# Patient Record
Sex: Female | Born: 1965 | Race: Black or African American | Hispanic: No | Smoking: Current every day smoker
Health system: Southern US, Community
[De-identification: ages and names within clinical notes are randomized; demographics above are authoritative.]

## PROBLEM LIST (undated history)

## (undated) DIAGNOSIS — M5090 Cervical disc disorder, unspecified, unspecified cervical region: Secondary | ICD-10-CM

## (undated) DIAGNOSIS — D649 Anemia, unspecified: Secondary | ICD-10-CM

## (undated) DIAGNOSIS — Z8669 Personal history of other diseases of the nervous system and sense organs: Secondary | ICD-10-CM

## (undated) DIAGNOSIS — G709 Myoneural disorder, unspecified: Secondary | ICD-10-CM

## (undated) DIAGNOSIS — M7989 Other specified soft tissue disorders: Secondary | ICD-10-CM

## (undated) DIAGNOSIS — IMO0001 Reserved for inherently not codable concepts without codable children: Secondary | ICD-10-CM

## (undated) DIAGNOSIS — R519 Headache, unspecified: Secondary | ICD-10-CM

## (undated) DIAGNOSIS — R110 Nausea: Secondary | ICD-10-CM

## (undated) DIAGNOSIS — R0981 Nasal congestion: Secondary | ICD-10-CM

## (undated) DIAGNOSIS — K589 Irritable bowel syndrome without diarrhea: Secondary | ICD-10-CM

## (undated) DIAGNOSIS — Z5189 Encounter for other specified aftercare: Secondary | ICD-10-CM

## (undated) DIAGNOSIS — G43909 Migraine, unspecified, not intractable, without status migrainosus: Secondary | ICD-10-CM

## (undated) DIAGNOSIS — K469 Unspecified abdominal hernia without obstruction or gangrene: Secondary | ICD-10-CM

## (undated) DIAGNOSIS — G8929 Other chronic pain: Secondary | ICD-10-CM

## (undated) DIAGNOSIS — I10 Essential (primary) hypertension: Secondary | ICD-10-CM

## (undated) DIAGNOSIS — R51 Headache: Secondary | ICD-10-CM

## (undated) DIAGNOSIS — E785 Hyperlipidemia, unspecified: Secondary | ICD-10-CM

## (undated) HISTORY — DX: Other specified soft tissue disorders: M79.89

## (undated) HISTORY — DX: Nausea: R11.0

## (undated) HISTORY — DX: Essential (primary) hypertension: I10

## (undated) HISTORY — DX: Hyperlipidemia, unspecified: E78.5

## (undated) HISTORY — DX: Headache: R51

## (undated) HISTORY — DX: Cervical disc disorder, unspecified, unspecified cervical region: M50.90

## (undated) HISTORY — DX: Migraine, unspecified, not intractable, without status migrainosus: G43.909

## (undated) HISTORY — DX: Headache, unspecified: R51.9

## (undated) HISTORY — DX: Myoneural disorder, unspecified: G70.9

## (undated) HISTORY — DX: Anemia, unspecified: D64.9

## (undated) HISTORY — DX: Nasal congestion: R09.81

## (undated) HISTORY — DX: Reserved for inherently not codable concepts without codable children: IMO0001

## (undated) HISTORY — DX: Other chronic pain: G89.29

## (undated) HISTORY — DX: Irritable bowel syndrome, unspecified: K58.9

## (undated) HISTORY — DX: Personal history of other diseases of the nervous system and sense organs: Z86.69

## (undated) HISTORY — DX: Encounter for other specified aftercare: Z51.89

## (undated) HISTORY — DX: Unspecified abdominal hernia without obstruction or gangrene: K46.9

## (undated) HISTORY — PX: SMALL INTESTINE SURGERY: SHX150

---

## 1999-12-14 ENCOUNTER — Encounter: Admission: RE | Admit: 1999-12-14 | Discharge: 2000-03-13 | Payer: Self-pay | Admitting: Anesthesiology

## 2001-05-30 ENCOUNTER — Emergency Department (HOSPITAL_COMMUNITY): Admission: EM | Admit: 2001-05-30 | Discharge: 2001-05-30 | Payer: Self-pay | Admitting: *Deleted

## 2002-07-02 ENCOUNTER — Emergency Department (HOSPITAL_COMMUNITY): Admission: EM | Admit: 2002-07-02 | Discharge: 2002-07-02 | Payer: Self-pay | Admitting: Emergency Medicine

## 2002-07-02 ENCOUNTER — Encounter: Payer: Self-pay | Admitting: Emergency Medicine

## 2002-07-29 ENCOUNTER — Ambulatory Visit (HOSPITAL_COMMUNITY): Admission: RE | Admit: 2002-07-29 | Discharge: 2002-07-29 | Payer: Self-pay | Admitting: General Surgery

## 2002-07-29 ENCOUNTER — Encounter: Payer: Self-pay | Admitting: General Surgery

## 2003-08-12 ENCOUNTER — Ambulatory Visit (HOSPITAL_COMMUNITY): Admission: RE | Admit: 2003-08-12 | Discharge: 2003-08-12 | Payer: Self-pay | Admitting: *Deleted

## 2006-01-08 ENCOUNTER — Ambulatory Visit: Payer: Self-pay | Admitting: Gastroenterology

## 2006-01-09 ENCOUNTER — Inpatient Hospital Stay (HOSPITAL_COMMUNITY): Admission: EM | Admit: 2006-01-09 | Discharge: 2006-01-17 | Payer: Self-pay | Admitting: Emergency Medicine

## 2006-01-09 ENCOUNTER — Ambulatory Visit: Payer: Self-pay | Admitting: Gastroenterology

## 2006-01-11 ENCOUNTER — Encounter (INDEPENDENT_AMBULATORY_CARE_PROVIDER_SITE_OTHER): Payer: Self-pay | Admitting: Specialist

## 2006-01-21 ENCOUNTER — Inpatient Hospital Stay (HOSPITAL_COMMUNITY): Admission: EM | Admit: 2006-01-21 | Discharge: 2006-01-25 | Payer: Self-pay | Admitting: Emergency Medicine

## 2006-02-03 ENCOUNTER — Inpatient Hospital Stay (HOSPITAL_COMMUNITY): Admission: EM | Admit: 2006-02-03 | Discharge: 2006-02-08 | Payer: Self-pay | Admitting: Emergency Medicine

## 2006-07-30 ENCOUNTER — Inpatient Hospital Stay (HOSPITAL_COMMUNITY): Admission: RE | Admit: 2006-07-30 | Discharge: 2006-08-05 | Payer: Self-pay | Admitting: General Surgery

## 2006-11-07 ENCOUNTER — Encounter: Admission: RE | Admit: 2006-11-07 | Discharge: 2006-11-07 | Payer: Self-pay | Admitting: General Surgery

## 2007-04-16 ENCOUNTER — Ambulatory Visit: Payer: Self-pay | Admitting: Cardiology

## 2007-04-29 ENCOUNTER — Ambulatory Visit: Payer: Self-pay

## 2007-05-02 ENCOUNTER — Ambulatory Visit: Payer: Self-pay

## 2008-05-24 IMAGING — CR DG CHEST 1V PORT
1 series · 1 of 1 positions shown · non-contrast
Comparison: none

CLINICAL DATA: PICC placement.  Intussusception.  
 PORTABLE CHEST - 01/10/2006 AT   9599 HOURS:

[view not recorded]
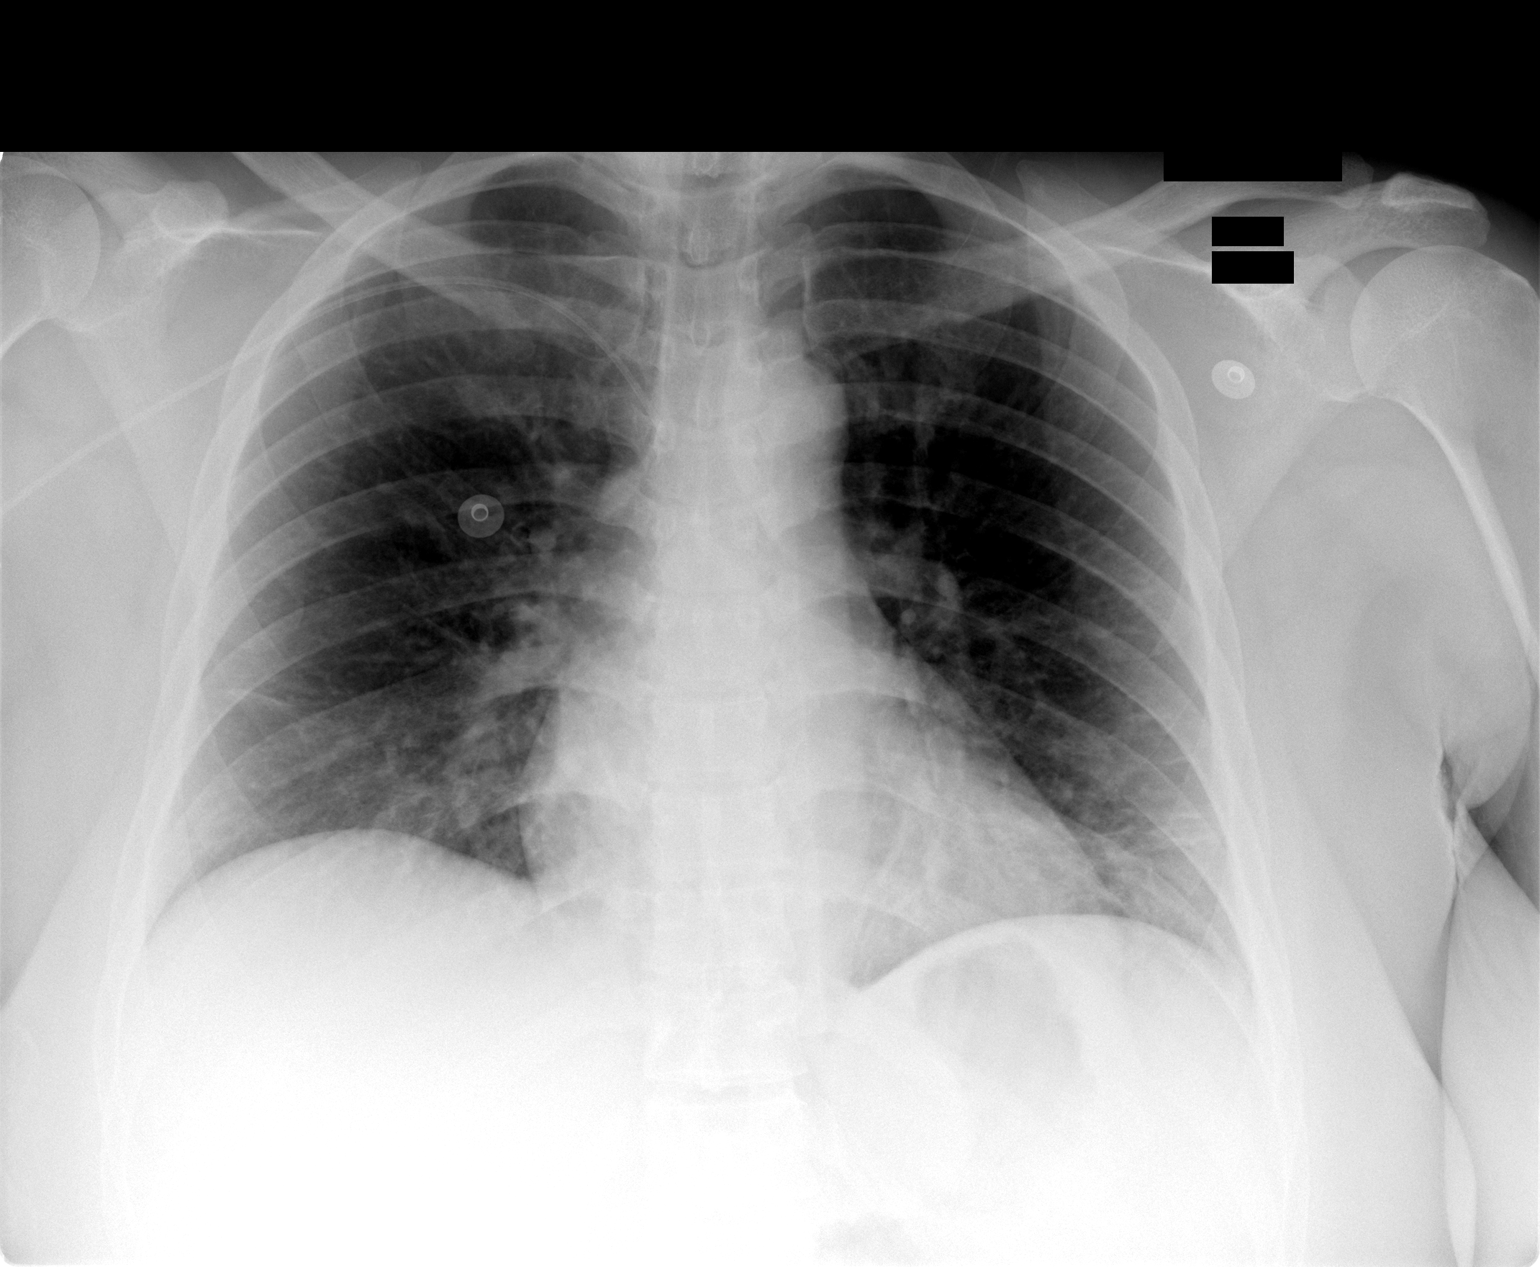

[1 of 1 positions shown; findings below may reference images not displayed]

FINDINGS: A right upper extremity PICC has been placed.  The tip projects over the upper SVC at the confluence of the right and left innominate veins.  Bibasilar linear atelectasis is noted with low lung volumes.  The heart is normal in size.
IMPRESSION: Right upper extremity PICC tip projects over the upper SVC.

## 2008-11-01 ENCOUNTER — Encounter: Admission: RE | Admit: 2008-11-01 | Discharge: 2008-11-01 | Payer: Self-pay | Admitting: General Surgery

## 2008-11-04 ENCOUNTER — Encounter: Admission: RE | Admit: 2008-11-04 | Discharge: 2009-02-02 | Payer: Self-pay | Admitting: Obstetrics & Gynecology

## 2009-11-25 ENCOUNTER — Encounter
Admission: RE | Admit: 2009-11-25 | Discharge: 2010-02-23 | Payer: Self-pay | Source: Home / Self Care | Attending: Physical Medicine & Rehabilitation | Admitting: Physical Medicine & Rehabilitation

## 2009-12-29 ENCOUNTER — Ambulatory Visit: Payer: Self-pay | Admitting: Physical Medicine & Rehabilitation

## 2010-03-25 ENCOUNTER — Encounter: Payer: Self-pay | Admitting: Family Medicine

## 2010-03-26 ENCOUNTER — Encounter: Payer: Self-pay | Admitting: *Deleted

## 2010-03-27 ENCOUNTER — Encounter: Payer: Self-pay | Admitting: Family Medicine

## 2010-03-27 ENCOUNTER — Encounter: Payer: Self-pay | Admitting: Obstetrics & Gynecology

## 2010-04-21 HISTORY — PX: HERNIA REPAIR: SHX51

## 2010-05-22 ENCOUNTER — Other Ambulatory Visit: Payer: Self-pay | Admitting: General Surgery

## 2010-05-22 DIAGNOSIS — Z1231 Encounter for screening mammogram for malignant neoplasm of breast: Secondary | ICD-10-CM

## 2010-05-25 ENCOUNTER — Ambulatory Visit: Payer: Self-pay

## 2010-07-18 NOTE — Assessment & Plan Note (Signed)
[facility]                            CARDIOLOGY OFFICE NOTE   [name], [name]                      MRN:          [id number]  DATE:04/16/2007                            DOB:          1965/09/02    The patient is a very pleasant 45 year old female with no prior cardiac  history whom I am asked to evaluate for chest pain and prior to ventral  hernia repair.  Note the patient does state that she has had  intermittent chest pain for approximately 2 years.  The pain is in the  left upper chest area and radiates down the left upper extremity.  It is  not exertional.  It can increase with inspiration and also certain  movements.  It is not related to food.  There is associated nausea and  shortness of breath.  There is no diaphoresis.  It can last anywhere  from minutes to an hour.  She is scheduled to have a ventral hernia  repair and we were asked to evaluate prior to surgery.  Note she also  has dyspnea on exertion.  There is occasional orthopnea but there is no  pedal edema.   MEDICATIONS:  1. Omeprazole 20 mg daily  2. Sertraline 100 mg tablets two p.o. daily  3. Oxycodone, methocarbamol, alprazolam as needed  4. Ambien as needed.  She takes ProAir as well.   ALLERGY:  PENICILLIN.   SOCIAL HISTORY:  She does smoke.  She does not consume alcohol.   FAMILY HISTORY:  Is positive for coronary artery disease as her mother  died of a myocardial function at age 20 by her report.   PAST MEDICAL HISTORY:  Significant for recently diagnosed hypertension.  There is no diabetes mellitus or hyperlipidemia.  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  It was removed and was  complicated by infection requiring 2 subsequent surgeries.  She then  developed a ventral hernia and is scheduled to have this repaired at  [facility] in the near future.  She has had a prior appendectomy as well as  all orthoscopic knee surgery.  There is also history of  anxiety by  report.   REVIEW OF SYSTEMS:  She denies any headaches or fevers, or chills.  There is no productive cough or hemoptysis,  there is no dysphagia,  odynophagia, melena or hematochezia.  There is no dysuria or hematuria.  Her menstrual cycles are abnormal at present as she has not had a cycle  in approximately 6 months since her most recent surgery.  She does have  some abdominal pain from her previous surgery as well.  There is no  rashes or seizure activity.  There is orthopnea, but there is no PND.  There is no pedal edema.  The remaining systems are negative.   PHYSICAL EXAMINATION:  Today shows a blood pressure of 126/85 and pulse  of 79.  She weighs 278 pounds.  She is well-developed and somewhat  obese.  She is no acute distress at present.  SKIN is warm and dry.  She is not depressed.  There is no peripheral  clubbing.  Her back is normal.  HEENT:  Normal with normal eyelids.  NECK is supple with a normal upstroke bilaterally.  No bruits heard.  There is no jugular venous distention and no thyromegaly noted.  CHEST is clear to auscultation, normal expansion.  CARDIOVASCULAR:  Regular rate and rhythm.  Normal S1-S2.  There are no  murmurs, rubs or gallops noted.  ABDOMEN:  Exam shows a diffuse tenderness to palpation.  She has had a  previous surgery and there is a ventral hernia as well as scar tissue.  It is tender to palpation.  I cannot appreciate hepatosplenomegaly  although the exam is somewhat difficult.  There are no bruits noted.  I  cannot palpate masses.  She has 2+ femoral pulses bilaterally.  No  bruits.  EXTREMITIES show no edema, I can palpate no cords.  She has 2+  dorsalis pedis pulses bilaterally.  NEUROLOGIC:  Grossly intact.  Her electrocardiogram shows sinus rhythm  at a rate of 72.  There are minor nonspecific ST changes.   DIAGNOSIS:  1. Chest pain - her symptoms are very atypical, but she does have      multiple risk factors and will require  ventral hernia repair in the      near future.  Will plan to proceed with adenosine Myoview.  If it      shows no ischemia then we will not pursue further cardiac      evaluation.  2. Preop evaluation - as per #1.  3. Recent hypertension - she will continue on her Felodipine  and this      was initiated recently by Dr. Loleta Chance and I have asked her to  follow-      up with him concerning that issue.  4. Ventral hernia - per general surgery.   We will see her back on as-needed basis pending results of her Myoview.     Madolyn Frieze Jens Som, MD, [facility]  Electronically Signed    BSC/MedQ  DD: 04/16/2007  DT: 04/17/2007  Job #: [id number]   cc:   Annia Friendly. Loleta Chance, MD  Gabrielle Dare Janee Morn, M.D.

## 2010-07-18 NOTE — Op Note (Signed)
[name], [name]             ACCOUNT NO.:  [id number]   MEDICAL RECORD NO.:  [id number]          PATIENT TYPE:  INP   LOCATION:  2550                         FACILITY:  [facility]   PHYSICIAN:  Gabrielle Dare. Janee Morn, M.D.DATE OF BIRTH:  [birth year]   DATE OF PROCEDURE:  07/30/2006  DATE OF DISCHARGE:                               OPERATIVE REPORT   PREOPERATIVE DIAGNOSIS:  Ventral incisional hernia.   POSTOPERATIVE DIAGNOSIS:  Ventral incisional hernia.   PROCEDURE:  Laparoscopic repair of ventral incisional hernia with mesh.   SURGEON:  Violeta Gelinas.   ASSISTANT:  Claud Kelp and Jimmye Norman.   ANESTHESIA:  General.   HISTORY OF PRESENT ILLNESS:  The patient is a 45 year old African  American female who underwent a small-bowel resection in [place] and  developed a postoperative wound infection.  She has completed treatment  for that, but she developed a ventral incisional hernia from this  previous wound.  We are proceeding today with elective laparoscopic  ventral incisional hernia repair.   PROCEDURE IN DETAIL:  Informed consent was obtained.  The patient  received intravenous antibiotics.  She was brought to the operating  room.  General anesthesia was administered by the anesthesia staff.  Her  abdomen was prepped and draped in a sterile fashion including an Ioban  drape.  Left lateral incision was made.  Quarter percent Marcaine with  epinephrine was used at all port sites.  An 11-mm hole was then placed  using the Optiview under direct vision without difficulty.  We then  insufflated the abdomen.  Camera was placed and revealed a lot of  abdominal adhesions up to the anterior bowel wall and into the hernia  defect.  Under direct vision, a right lateral port was placed  facilitating careful dissection of these adhesions.  Once these were  gradually swept away, we were able to place another 5-mm right lower  lateral port.  Then, we continued with very careful blunt  dissection  most of the adhesions and the hernia contents was omentum, but one piece  of small bowel was stuck up there.  It was protected, and the adhesions  were carefully taken down without injury to bowel.  Further dissection  freed up the left lower quadrant, and we were able to place a left lower  quadrant lateral 5-mm port.  Our 11-mm port was then noted to be a  little bit too close to the hernia edge, and it was placed more lateral  in order to facilitate access.  The remainder of the hernia contents  were reduced out of the abdomen, and further adhesiolysis was done.  The  hernia defect was then freed up.  The falciform ligament was divided  above in order to get a good overlay, and that was divided with the  laparoscopic LigaSure.  Excellent hemostasis was ensured.  Bowel area  was reinspected, and there was no enterotomies noted.  The size of the  defect was then measured out with spinal needles and marked off.  We  selected a piece of Parietex mesh that was 20 cm x 30 cm.  It  was marked  for orientation.  Eight 0 Prolene sutures were placed circumferentially  around it.  It was then rolled up and inserted into the abdomen.  The  sutures were brought up via small stab wounds circumferentially using  the EndoCatch, and these were all tied down securely.  The scope was put  back into the abdomen.  The mesh was up nicely except for on the left  lower quadrant area where it had slipped around the lip of the defect;  that suture was cut.  We then positioned the mesh again with a good 4 to  5 cm of overlap past the defect edge, and it was circumferentially  tacked with the Endotacker under direct vision.  We then placed more  concentric layer of staples where possible, a little bit closer to the  defect edge, achieving excellent application of the mesh.  Omentum and  everywhere the adhesiolysis was done was reinspected.  No active  bleeding was present and the bowel appeared intact.   We reinspected our  mesh from all angles, ensuring excellent tack application and  appearance.  Once this was done, the pneumoperitoneum was replaced, and  the ports were removed.  Wounds were copiously irrigated.  The port  sites were closed with skin staples, and the small stab wounds for the  sutures were closed with Benzoin and Steri-Strips.  Sponge, needle and  instrument counts were correct.  Sterile dressings were applied to all  wounds.  The patient tolerated the procedure well without apparent  complication and was taken to recovery in stable condition.      Gabrielle Dare Janee Morn, M.D.  Electronically Signed     BET/MEDQ  D:  07/30/2006  T:  07/30/2006  Job:  [id number]

## 2010-07-21 NOTE — Discharge Summary (Signed)
[name], TREMBLEY NO.:  [id number]   MEDICAL RECORD NO.:  [id number]          PATIENT TYPE:  INP   LOCATION:  5743                         FACILITY:  [facility]   PHYSICIAN:  Sandria Bales. Ezzard Standing, M.D.  DATE OF BIRTH:  07-09-1965   DATE OF ADMISSION:  02/03/2006  DATE OF DISCHARGE:  02/08/2006                               DISCHARGE SUMMARY   CHIEF COMPLAINT AND REASON FOR ADMISSION:  Ms. [name] is a 45 year old  female patient on disability secondary to chronic back pain, previously  worked as a Adult nurse.  She was admitted to [facility]  in early November with a small bowel obstruction secondary to  intussusception.  On January 14, 2006, she underwent resection of a small bowel tumor by  Dr. Lovell Sheehan.  Postoperative course was complicated by a wound infection  followed up with Dr. Lovell Sheehan on January 19, 2006.  The wound was  opened.  She grew out multiple organisms.  Wound care was done for 3  days and then Dr. Lovell Sheehan took the patient back to the operating room  and reclosed the wound with sutures with intact fascia noted at that  time.  She was taking Levaquin since that procedure and has noted  purulent drainage and burning pain in the wound since her follow up with  him on January 31, 2006.  On the morning of admission she noticed a large amount odor and purulent  discharge from the central portion of the wound, so she presented to the  [facility] ER.  In the ER she was afebrile, vital signs were stable,  her abdomen was benign with some purulent discharge from a small sinus  tract at the central portion of abdomen.  Wound was closed with  interrupted sutures.  There was foul odor noted to the drainage and a  small line of erythema around this area.  In the ER, Dr. Janee Morn  prepped and draped the wound in sterile fashion and opened the wound by  removing the sutures and about 150-200 mL of cloudy purulent discharge  was removed, cultures  were sent off.  Because of these findings, the  patient was admitted to the hospital.   ADMITTING DIAGNOSES:  1. Infected postoperative surgical incision.  2. Status post resection small-bowel tumor with intussusception.  3. History of depression and chronic back pain on disability for 5      years.   HOSPITAL COURSE:  The patient was admitted to the general floor as noted  where she was started on IV Cipro and Flagyl.  CT scan of the abdomen and pelvis was done and this did reveal a  tracking of abscess just beneath the mid portion of the wound tacking  laterally.  The CT showed no intraabdominal process.   Plans were to attempt to open this area at the bedside, but the patient  was reluctant to have this done.  She was very emotional, crying and  sobbing, would not let staff get near the wound, stating that she had  too much done and had gone through too much and she could not  take it  anymore and requested that the procedure be done under general  anesthesia.  On February 04, 2006, the patient was taken to the OR by  Dr. Ezzard Standing, where she underwent an open debridement of the abdominal  wound.  There was actually a large pocketing of abscess drained.  Please  refer to Dr. Allene Pyo operative note for further details.   In the postop period, the patient did well physiologically.  Emotionally  she was still having difficulty coping with an open wound and having  issues with recurrent infection.  There was some confusion as to the  medications the patient was taking at home.  She had one listing of  medications on a medication reconciliation sheet, but on discussing her  Xanax, she told me that she was taking 1 mg every 6 hours for the past  month and then when requestioned this, restated oh, that was before I  was having my surgical procedure that I was taking that much.  Eventually the patient was able to tolerate dressing changes better.   Because of her psychological and  emotional lability, it was discussed  with the patient and she agreed with [place] that psychiatric consult would  be indicated.  Dr. Jeanie Sewer did see the patient on February 06, 2006,  and felt the patient had anxiety and major depressive disorder and has  started her on a tapering dose of Zoloft with end dose to be at 200 mg  per day.   During the hospitalization, patient has also been reluctant to mobilize  and participate in self-care.  A lot of that has been related to her  depression, but she also is complaining of issues with back pain.  For  her chronic issues, PT evaluation was obtained and note home health PT  needs for recognized if the patient continued to be unsteady with a day  which was felt to be related to heavy dosing of pain medications than a  true gait disturbance.  They felt she was benefit from a walker if this  continued.   By date of discharge, the patient's wound was clean with a pink-red  granular base.  There was an exposed PDS suture and a few areas of  fibrin debris which Dr. Ezzard Standing felt could be reevaluated in a week at  the office.  The patient was requesting a shower chair at discharge due  to complaints of increasing back pain and abdominal pain when standing.  This request was honored.  Wound cultures grew out coag-negative staph  and it was felt that antibiotics would not need to be continued at  discharge.   FINAL DISCHARGE DIAGNOSES:  1. Postop wound infection, status post intraoperative incision and      drainage of abscess, status post recent small bowel tumor resection      with associated intussusception.  2. Anxiety and major depressive disorder, psychiatric evaluation this      admission.  3. Chronic back pain on disability.   DISCHARGE MEDICATIONS:  1. Zoloft 25 mg tablet, the patient is to take half a tablet or 12.5      mg and increase the dosage by half a tablet or 12.5 mg daily until     she is taking a total of 100 mg each morning.   Once she achieves      100 mg daily day dose, she is to increase the dose by 50 mg or 2      pills weekly until she  is at a final dose of 200 mg each a.m.  2. Percocet 5/325 one to two tablets every 4 hours as needed for pain,      #40 dispensed with no refills.  3. Xanax as before admission.  4. Ibuprofen over-the-counter 2-3 tablet every 8 hours as need for      pain.  5. Prilosec OTC or Protonix 40 mg daily while taking any ibuprofen.  6. Colace over-the-counter 100 mg b.i.d. for constipation.   DIET:  No restrictions.   ACTIVITY:  Increase activity slowly.  May shower.  No driving while  taking Percocet.  No lifting more than 10 pounds until the abdominal  wound is healed.   WOUND CARE:  Normal saline packing to the abdomen wound twice daily,  cover with dry dressing.  Home health RN to follow.   REFERRALS:  [facility] will be following the patient after  discharge, [phone].   FOLLOW UP APPOINTMENTS:  1. She needs to call Dr. Allene Pyo office, [phone] to be seen in one      week.  2. She needs to follow-up with her family doctor, Dr. Loleta Chance, to be seen      in 1 week.      Allison L. Rennis Harding, N.P.      Sandria Bales. Ezzard Standing, M.D.  Electronically Signed    ALE/MEDQ  D:  02/08/2006  T:  02/08/2006  Job:  [id number]   cc:   Dalia Heading, M.D.  Annia Friendly. Loleta Chance, MD  Antonietta Breach, M.D.

## 2010-07-21 NOTE — Consult Note (Signed)
NAMEMARIKAY, ROADS NO.:  [id number]   MEDICAL RECORD NO.:  [id number]          PATIENT TYPE:  INP   LOCATION:  5743                         FACILITY:  [facility]   PHYSICIAN:  Antonietta Breach, M.D.  DATE OF BIRTH:  [birth year]   DATE OF CONSULTATION:  DATE OF DISCHARGE:                                 CONSULTATION   REQUESTING PHYSICIAN:  Dr. Janee Morn.   REASON FOR CONSULTATION:  Anxiety and depression.   DATE OF CONSULTATION:  February 06, 2006.   HISTORY OF PRESENT ILLNESS:  [name] is a 45 year old  female admitted to the [facility] on February 03, 2006, due  to pus and drainage from an abdominal wound.   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  She developed a postoperative  wound infection.  She had a number of visits to [place] emergency  room for wound complications.  She states that the procedures that were  done to treat her wound infection involved leaving out pain medication  and anesthesia on a number of occasions.  Her procedures had to be  repeated a number of times due to ongoing pus and drainage from the  wound.  She states that the wound was closed too soon and resulted in  ongoing infection.   At the urgence of her family, she chose to turn to a new hospital.  They  drove her to [facility].  Upon evaluation at the [facility] Emergency  Room, the patient was again having foul-smelling purulent drainage from  her wound and was admitted to the [facility] for further  evaluation and treatment.   The patient required incision and drainage, 150 to 200 mL of cloudy  purulent discharge was removed, and a CT was taken.   CT revealed evidence of a subcutaneous loculation.  The wound was found  to be necrotic with subcutaneous pockets and she was taken back to the  inpatient ward where dressing changes have continued.   The patient  describes all evaluation and treatment done so far during  this hospital course with good pain control and good anesthetic control;  however, she continues to have nightmares about painful abdominal  procedures.  She has frequent feeling on edge, muscle tension, and some  shortness of breath.  She also has noted that when she is watching TV  she avoids any scenes that involve hospitals.  She has insomnia.   The patient's mood is moderately depressed with decreased energy and  some difficulty concentrating.  Two days ago she had a prayer that God  would allow her to die.  These thoughts of wishing death have passed now  and she has not had any thoughts of taking her own life.  She has not  had thoughts of harming others, delusions, or hallucinations.  She is  socially appropriate and cooperative with care.  Her orientation is  intact and her memory is intact.   PAST PSYCHIATRIC HISTORY:  The patient has no history of decreased need  for sleep  or heightened energy.  She does have a history of prior  periods of depression after her parents died.   The patient was treated with Zoloft successfully, increased to 200 mg  per day.  She also had some counseling.  She never has attempted  suicide, never had any psychiatric admissions.   FAMILY PSYCHIATRIC HISTORY:  None known.   SOCIAL HISTORY:  The patient has no children.  She is married and  currently separated.  She is not currently working.  She is on medical  disability due to a back injury.  She does not use any illegal drugs.  She dos not drink alcohol.  She has a strong support from her religion.  She used to work as a Systems analyst.   GENERAL MEDICAL PROBLEMS:  1. Chronic back pain.  2. History of appendectomy.  3. History of right lower extremity surgery.   MEDICATIONS:  The patient's MAR is reviewed.  She is currently on Xanax  1 mg q.6 hours p.r.n.  She has required 1.25 mg of Xanax today, so far  at 4 p.m.    ALLERGIES:  1. PENICILLIN.  2. MORPHINE SULFATE.  3. HYDROMORPHONE.   LABORATORY DATA:  WBC 9.8, hemoglobin 12.8, platelets 313, BUN 4,  creatinine 0.6, calcium 9.2.   REVIEW OF SYSTEMS:  CONSTITUTIONAL:  Afebrile.  HEENT:  Head:  No  trauma.  Eyes:  No visual changes.  Ears:  No hearing impairment.  Nose:  No rhinorrhea.  Mouth and throat, no sore throat.  NEUROLOGIC:  Unremarkable.  PSYCHIATRIC:  The patient has a history of previous Xanax  prescription a number of years ago when she was being with Zoloft.  In  the record from her previous hospitalization she was noted to be on 60  mg of Cymbalta per day and an unknown amount of Seroquel.  CARDIOVASCULAR:  No chest pain, palpitations, or edema, other than some  palpitations that have occurred when she is anxious.  RESPIRATORY:  No  coughing or wheezing.  GASTROINTESTINAL:  As above.  The original  procedure did involve a correction of small bowel obstruction and  intussusception.  GENITOURINARY:  No dysuria.  SKIN:  As above.  MUSCULOSKELETAL:  Chronic low back pain.  HEMATOLOGIC/LYMPHATIC:  Unremarkable.  ENDOCRINE/METABOLIC:  Unremarkable.   PHYSICAL EXAMINATION:  VITAL SIGNS:  Temperature 97.7, pulse 89,  respirations 20, blood pressure 133/53, O2 saturation on room air 97%.   MENTAL STATUS EXAM:  [name] is alert.  She is well-groomed with  good eye contact, lying in a partially-reclined, supine position in her  hospital bed, appearing her stated age.  Her affect is constricted with  tears and appropriate to content.  Her mood is depressed.  Thought  process is logical, coherent, goal directed.  No looseness of  associations.  Thought content, no thoughts of harming herself, no  thoughts of harming others.  No delusions, no hallucinations.  Her fund  of knowledge and intelligence are within normal limits.  Her speech  involves normal rate and prosody.  Her concentration is mildly decreased.  Her memory is intact to  immediate recent remote.  She is  oriented to all spheres.  Her insight is good.  Her judgment is intact.  She is socially appropriate.   ASSESSMENT:  Axis I.  293.84, anxiety disorder, not otherwise specified.  Axis II.  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  Axis III.  See general medical problems.  Axis IV.  General medical.  Axis V.  63.   [name] is not at risk to harm herself or others.  She agrees to  call emergency services immediately for any thoughts of harming herself,  thought of harming others, or distress.   The undersigned provided ego-supportive psychotherapy and education.  The undersigned also introduced the patient to the principles of  cognitive behavioral therapy and advised her to start sessions each day  where she confronts the catastrophic thoughts regarding going home.   The patient has been having frequent catastrophic thinking about going  home and having the same wound complications re-emerging.  The  undersigned demonstrated to the patient how to confront her  catastrophizing thoughts with prognostic thinking that is based upon the  updated procedure that she has undergone along with the average rate of  complications given a normal immune system, etc.  She understood the  discussion and will proceed with confronting her thinking about the  future to shape thoughts into those that are based on the normal rates  of complications.  She was also re instructed in deep breathing and  progressive muscle relaxation, instruction which she has received before  when she underwent multidisciplinary pain treatment a number of years  ago.   The indications, alternatives, and adverse effects, of Zoloft and Ativan  were discussed with the patient.  She understands above information  including the risk of dependence with Ativan and would like to proceed  as follows below.   RECOMMENDATIONS:  1. Restart Zoloft at a very low dose given the patient's  potentially      sensitive gastrointestinal condition:  Zoloft 12.5 mg q. a.m. and      then advance as tolerated by 12.5 mg per day to 100 mg p.o. q. a.m.      At that point, would increase by 50 mg per week to the target dose      of 200 mg q. a.m., the previous effective dosage.  Zoloft is      utilized at this point both for anti depression and antianxiety.  2. Would switch from Xanax over to Ativan.  They have equal potency;      however, Ativan provides a diversity of routes as well as no active      metabolites that can result in accumulative sedation.  Ativan 1 mg      p.o., IM, or IV q.6 hours p.r.n. acute anxiety will be written.  3. The patient will proceed with practicing progressive muscle      relaxation and deep breathing.  She will also confront her      cognitive distortions with rational thoughts based on the average      post wound complication rates as well as the progress she is making      now with her current wound treatment. 4. Regarding preliminary discharge planning:  The patient appears to      be in excellent candidate for outpatient cognitive behavioral      therapy as well as psychotropic medication management.  Would ask      the case manager to set this patient up within the first week of      discharge at one of the outpatient psychiatric clinics of [facility], [facility], or [facility].   The optimal results from using an SSRI with cognitive behavioral therapy  in treating anxiety may not be seen until the 16th week, but many  symptoms of depression will improve much sooner.  Antonietta Breach, M.D.  Electronically Signed     JW/MEDQ  D:  02/06/2006  T:  02/07/2006  Job:  [id number]

## 2010-07-21 NOTE — Discharge Summary (Signed)
[name], MCCONAHY NO.:  [id number]   MEDICAL RECORD NO.:  [id number]          PATIENT TYPE:  INP   LOCATION:  A303                          FACILITY:  [facility]   PHYSICIAN:  Dalia Heading, M.D.  DATE OF BIRTH:  12/13/1965   DATE OF ADMISSION:  01/09/2006  DATE OF DISCHARGE:  11/15/2007LH                                 DISCHARGE SUMMARY   AGE:  45 years old.   HOSPITAL COURSE:  The patient is a 45 year old black female who presented to  the emergency room with worsening abdominal pain, nausea, and vomiting.  A  CT scan of the abdomen and pelvis was done at the time of admission which  revealed a partial small-bowel obstruction secondary to intussusception of  the small bowel.  She was admitted to hospital for further evaluation and  treatment.  Her symptoms did not improve over the next 24 hours, thus the  patient went to the operating room on January 11, 2005 and underwent a  partial small bowel resection.  She was found to have a large mass in the  mid portion of the jejunum.  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  Further immunohistological stains were  pending.  Her postoperative course was remarkable for mild fevers.  Her  white blood cell count was virtually normal on January 15, 2006.  Her diet  was advanced without difficulty once her bowel function returned.   The patient is being discharged home on January 17, 2006 in good improving  condition.   DISCHARGE INSTRUCTIONS:  The patient is to followup with Dr. Franky Macho on  January 22, 2006.   DISCHARGE MEDICATIONS:  1. Vicodin 1-2 tablets p.o. q.4 h p.r.n. pain.  2. Phenergan 25 mg p.o. q.6 h p.r.n. nausea.  3. She is to resume all her other medications as previously prescribed.   PRINCIPAL DIAGNOSES:  1. Intussusception of small bowel.  2. Benign neoplasm, small-bowel.  3. Depression.  4. Chronic back pain.   PRINCIPAL PROCEDURE:  Partial small  bowel resection on January 11, 2006.      Dalia Heading, M.D.  Electronically Signed     MAJ/MEDQ  D:  01/17/2006  T:  01/17/2006  Job:  [id number]   cc:   Kassie Mends, M.D.  [address]  [place] , [place] [address]   Annia Friendly. Loleta Chance, MD  Fax: [phone]

## 2010-07-21 NOTE — Consult Note (Signed)
NAMETINITA, BROOKER             ACCOUNT NO.:  [id number]   MEDICAL RECORD NO.:  [id number]          PATIENT TYPE:  INP   LOCATION:  A303                          FACILITY:  [facility]   PHYSICIAN:  Kassie Mends, M.D.      DATE OF BIRTH:  Dec 18, 1965   DATE OF CONSULTATION:  01/09/2006  DATE OF DISCHARGE:                                   CONSULTATION   REASON FOR CONSULTATION:  Abdominal pain and intussusception.   HISTORY OF PRESENT ILLNESS:  Ms. Mower is a 45 year old female who was  seen in clinic on November 6 complaining of diarrhea, abdominal pain, and  bloating.  She was being worked up for her abdominal pain, diarrhea, and  bloating, and was scheduled for an upper endoscopy.  She states she went  home and took the medicine and tried to sleep but then started throwing up  and dry heaving.  Her symptoms got acutely worse and she reported not being  able to keep anything down.  She tried to keep down water and Jello.  Her  last bowel movement was on the day that she saw me.  She denied any blood in  her vomit, heartburn, or chest pain.  She denied any fever.  She continues  to feel nauseated but her pain is now controlled.   PAST MEDICAL HISTORY:  1. Migraines.  2. Asthma.  3. Back pain.  4. Knee pain.  5. Arthritis.  6. Hypertension.   PAST SURGICAL HISTORY:  1. Right knee surgery.  2. Appendectomy.  3. Right carpal tunnel surgery.  4. Back surgery.   ALLERGIES:  PENICILLIN AND ASPIRIN.   MEDICATIONS:  1. Levaquin.  2. Nicoderm.  3. Protonix.  4. Dilaudid as needed.  5. Zofran as needed.   SOCIAL HISTORY:  She is married and has no children.  She smokes one pack a  day.  She occasionally drinks alcohol.   REVIEW OF SYSTEMS:  As per the HPI, otherwise, all systems are negative.   PHYSICAL EXAMINATION:  VITAL SIGNS:  Temperature 99.2, blood pressure 150/75, weight 108 kilos.  GENERAL:  She is in no apparent distress, alert and oriented x4.  HEENT:  Exam is  normocephalic, atraumatic, pupils equal and reactive to light, mouth  with no oral lesions.  Posterior pharynx without erythema or exudate.  NECK:  Full range of motion, no lymphadenopathy. LUNGS:  Clear to auscultation  bilaterally.  CARDIOVASCULAR EXAM:  Regular rhythm, no murmur, normal S1 and  S2.  ABDOMEN:  Bowel sounds are present, soft, distended, mild tenderness to  palpation in all four quadrants, no rebound or guarding.  No abdominal  bruits.  EXTREMITIES:  No cyanosis, clubbing, and edema.  NEUROLOGICAL:  No  focal neurological deficits.   LABORATORY DATA:  White count 17.8, hemoglobin 14.9, platelets 378.  BUN 10,  creatinine 1, normal liver enzymes.   ASSESSMENT:  Ms. Kloosterman is a 45 year old female with small bowel  intussusception of unclear etiology.  She has now developed persistent  vomiting and is not passing gas or stool.  She still continues to complain  of nausea.  Thank you for allowing me to see Ms. Guinther in consultation.  My recommendations follow.   RECOMMENDATIONS:  1. Will attempt to place NG tube this afternoon with sedation in short      stay.  2. Will change Zofran to q.4h. as needed for more ideal control of nausea.  3. If her intussusception resolves spontaneously, then would perform      capsule endoscopy to evaluate her small intestines.  4. Will cancel endoscopy scheduled for January 10, 2006.      Kassie Mends, M.D.  Electronically Signed     SM/MEDQ  D:  01/10/2006  T:  01/10/2006  Job:  [id number]

## 2010-07-21 NOTE — H&P (Signed)
[facility]  Patient:    [name], [name]                        MRN: [id number] Adm. Date:  [id number] Attending:  Thyra Breed CC:         _____________,  Jonita Albee, [place]  Dr. Mirna Mires, [place], [place]   History and Physical  NEW PATIENT EVALUATION  HISTORY:  The patient is a 45 year old who is sent to [place] by ______ for a series of lumbar epidural steroid injection.  The patient states that she was in her usual state of health up until June 28, 1999, when she fell down stairs and injured her back and right knee. She initially had her right knee addressed and two months after the fall, apparently had surgical intervention for a medial meniscal repair and medial collateral ligament tear.  She stated that she continues to have a lot of sharp pain in that knee.  She has been treated on Percocet, which helps her, Skelaxin, which was of no benefit, Zanaflex, which was not helpful, that made her sleepy, Vicodin, which made her hyperactive and sleepy, and Vioxx, which helps to some degree.  She describes her pain as a sharp pain predominantly localized to the knee with associated numbness emanating over the anterior aspect of the right thigh.  Occasionally, it will go down to the toes, involving the fourth, fifth and sometimes third and ______ toes.  She described give-away weakness of her lower extremities and problems with balance at times.  She denied any bowel or bladder incontinence but does have urgency.  Her pain is made worse by standing or sitting long periods of time and improved by a hot tub of water and two Percocets.  The Percocet lasts about three hours.  The patient alleges that her migraine headaches have returned and depression profoundly intensified following the fall.  X-rays of her back have revealed bulging at 4-5 and 5-S1, with an MRI showing a diffuse bulge at 4-5 minimally, indenting the thecal sac, with no evidence of neural compression or  disk herniation.  She is sent today for consideration for epidural steroid injections.  She states that she hurts in her head and up into her upper back and especially to the right side and predominantly into the knee.  She has poor quality of sleep.  CURRENT MEDICATIONS:  Percocet, Ortho-Cyclen and Zoloft.  ALLERGIES:  PENICILLIN and ASPIRIN.  FAMILY HISTORY:  Positive for coronary artery disease, hypertension and diabetes.  PAST SURGICAL HISTORY:  Significant for right wrist surgery by ______ in 1995 for a work-related injury, appendectomy and a right knee surgery.  SOCIAL HISTORY:  The patient is a pack-per-day smoker.  She does not drink alcohol.  She has been out of work and on disability since her accident.  ACTIVE MEDICAL PROBLEMS:  Depression and migraine headaches.  REVIEW OF SYSTEMS:  GENERAL:  Negative.  HEAD:  Significant for migraine headaches.  EARS:  Significant for some tinnitus of the left ear. NOSE/MOUTH/THROAT:  Negative.  EYES:  Significant for strain.  LUNGS: Negative.  CARDIOVASCULAR:  Negative.  GI:  Negative.  GU:  Negative. MUSCULOSKELETAL/NEUROLOGIC:  See HPI.  No history of seizures or strokes. CUTANEOUS:  Negative.  HEMATOLOGIC:  History of anemia in the past. ENDOCRINE:  Negative.  PSYCHIATRIC:  Significant for depression. ALLERGY/IMMUNOLOGIC:  The patient states that she does have difficulties with hayfever at times.  EXAMINATION  VITAL SIGNS:  Heart rate  is 71, respiratory rate is 16, blood pressure is 96/68, O2 saturation is 98% and pain level is 8/10; she did not take any medications this morning.  GENERAL:  This is a pleasant female who is teary-eyed and has prominent pain behavior.  HEENT:  Head was normocephalic and atraumatic.  Eyes:  Extraocular movements were intact with conjunctivae and sclerae clear.  Nose:  Patent nares without discharge.  Oropharynx was free of lesions.  NECK:  Supple without lymphadenopathy.  Carotids are 2+ and  symmetric without bruits.  MUSCULOSKELETAL:  She had tenderness through the trapezius muscles, interscapular regions as well as over the anterior chest wall, lateral epicondylar regions and to a lesser extent over the anserine areas and distal malleoli, left greater than right.  Trochanteric areas were minimally tender. The patient stated she was unable to lie flat without a pillow under her back.  LUNGS:  Clear.  HEART:  Regular rate and rhythm.  BREASTS:  Not performed.  ABDOMEN:  Not performed.  PELVIC:  Not performed.  RECTAL:  Not performed.  BACK:  Exam revealed diffuse tenderness of the paraspinous muscles, with negative straight leg raise signs.  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  I could not appreciate any effusion of the joint.  She hyperextends the knees bilaterally by about 10 degrees and can flex to about 110 degrees.  NEUROLOGIC:  The patient was oriented x 4.  Cranial nerves II-XII were grossly intact.  Deep tendon reflexes were symmetric in the upper and lower extremities with downgoing toes.  Motor was grossly intact with symmetric bulk and tone.  Sensory was significant for attenuated pinprick over the anterior aspect of the right knee, but intact over the feet.  Vibratory sense was intact.  Coordination was grossly intact.  IMPRESSION 1. Diffuse pain syndrome with what appears to be prominent pain behavior.  She    obviously is in a lot of discomfort from her right knee, where minimal    pressure over the patella elicits a severe discomfort.  She exhibited    tender points throughout her back and in her upper extremities. 2. History of depression. 3. Migraine headaches. 4. Allergies.   DISPOSITION:  I discussed the potential risks, benefits and limitations of a lumbar epidural steroid  injection in detail with the patient.  After reviewing these, the patient was very concerned about proceeding.  She seems to want a lot of reassurance that this will be helpful to her and that she will not have any problems and I advised her that I cannot guarantee the procedure, but only review the potential benefits, limitations and risks.  She seems to be very concerned about proceeding with this.  I advised her that it would likely not help with her interscapular discomfort, migraine headaches or right upper extremity discomfort; it may or may not benefit all of her knee pain, as some of it seems to have a radicular quality to it and other parts of it seem to be more intrinsic to the knee joint itself.  After discussing options with her, she wishes to return back to see ______ to discuss any further block therapy with him in greater detail.  I advised the patient that at this point I had little further to add to her management.  She plans to follow up with  ______ and Dr. Mirna Mires in [place]. DD:  01/19/00 TD:  01/19/00 Job: [id number]

## 2010-07-21 NOTE — H&P (Signed)
NAMEDELMI, FULFER NO.:  [id number]   MEDICAL RECORD NO.:  [id number]          PATIENT TYPE:  INP   LOCATION:  A303                          FACILITY:  [facility]   PHYSICIAN:  Dalia Heading, M.D.  DATE OF BIRTH:  1965-12-09   DATE OF ADMISSION:  01/09/2006  DATE OF DISCHARGE:  LH                                HISTORY & PHYSICAL   CHIEF COMPLAINT:  Abdominal pain.   HISTORY OF PRESENT ILLNESS:  The patient is a 45 year old black female who,  for the past 2 weeks, has been having intermittent crampy abdominal pain.  She was seen by Dr. Cira Servant yesterday, of Gastroenterology, and was scheduled  to undergo an EGD on Friday with the preliminary diagnosis of IBS.  Due to  worsening abdominal pain, nausea, vomiting, she presented to the emergency  room.  CT scan of the abdomen and pelvis reveals a partial bowel obstruction  secondary to a small bowel intussusception in the left side of the abdomen.  On CT scan, there is no free air or free fluid present within the abdominal  cavity.  The patient currently is having mild pain but no nausea or  vomiting.  Her bowel movements have been normal and she last had a bowel  movement earlier today.   PAST MEDICAL HISTORY:  1. Depression.  2. Chronic back pain.   PAST SURGICAL HISTORY:  1. Appendectomy.  2. Right arm surgery.  3. Attempted back surgery in the past.   CURRENT MEDICATIONS:  1. Promethazine.  2. Omeprazole.  3. Hydrocodone.  4. Celebrex.  5. Lidoderm.  6. Maxalt.  7. Combivent.  8. Cymbalta.  9. Seroquel.   ALLERGIES:  PENICILLIN WHICH IS CAUSED WHICH CAUSES SWELLING.   REVIEW OF SYSTEMS:  The patient does smoke.  She denies any alcohol use.   PHYSICAL EXAMINATION:  GENERAL:  The patient is a well-developed, well-  nourished black female in no acute distress.  VITAL SIGNS:  She is afebrile.  Vital signs stable.  LUNGS:  Clear to auscultation with good breath sounds bilaterally.  HEART:  Regular  rate and rhythm without history, S4, murmurs.  ABDOMEN:  Soft with nonspecific tenderness in the upper abdomen.  No  significant distension is noted.  No rigidity is noted.  RECTAL:  Deferred at this time.   LABORATORY DATA:  White blood cell count is 17.8, hematocrit 44, platelet  count 378.  88 segs, 6 lymphs, no bands.  Met-7 is within normal limits.  Liver enzyme tests are within normal limits.  Urine pregnancy test is  negative.   IMPRESSION:  Partial small-bowel obstruction secondary to small bowel  intussusception.   PLAN:  As the patient is more comfortable and not having episodes of nausea  or vomiting, she will be admitted to the hospital for further evaluation  treatment.  She has been told that she may end up having surgery to correct  the intussusception.  Should this resolve, a small bowel examination will be  performed by either Givens capsule or small bowel series.  Dr. Cira Servant will be  consulted in the morning  to notify her of the patient's admission.      Dalia Heading, M.D.  Electronically Signed     MAJ/MEDQ  D:  01/09/2006  T:  01/10/2006  Job:  [id number]   cc:   Kassie Mends, M.D.  [address]  [place] , [place] [address]   Annia Friendly. Loleta Chance, MD  Fax: [phone]

## 2010-07-21 NOTE — H&P (Signed)
[name], [name]             ACCOUNT NO.:  [id number]   MEDICAL RECORD NO.:  [id number]          PATIENT TYPE:  INP   LOCATION:  5743                         FACILITY:  [facility]   PHYSICIAN:  Gabrielle Dare. Janee Morn, M.D.DATE OF BIRTH:  May 04, 1965   DATE OF ADMISSION:  02/03/2006  DATE OF DISCHARGE:                              HISTORY & PHYSICAL   CHIEF COMPLAINT:  Pain and drainage from abdominal wound.   HISTORY OF PRESENT ILLNESS:  The patient is a 45 year old African  American female who was initially admitted to [facility] on  January 10, 2006, with small bowel obstruction.  She underwent  exploration by Dr. 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  She was discharged home  postoperatively but returned with a wound infection on January 19, 2006.  Dr. Lovell Sheehan readmitted her at that time.  Her wound was opened,  cultures grew out multiple organisms.  Wound care was done for 3 days,  then Dr. Lovell Sheehan took her back to the operating room and closed her  wound with sutures.  Fascia was reported to be intact at that time.  She  subsequently was discharged from the hospital.  She saw him on January 31, 2006, in his office.  She has been taking Levaquin since being  discharged the second time.  She has been having increasing burning pain  in her wound.  This morning she noted a large odor and purulent  discharge from the central portion.  She came to the [facility]  Emergency Department for evaluation.  She claims she does not want to  see Dr. Lovell Sheehan at this time.   PAST MEDICAL HISTORY:  1. Depression.  2. Chronic back pain.   PAST SURGICAL HISTORY:  1. Appendectomy.  2. Right lower extremity surgery.  3. Small-bowel resection as above.   MEDICATIONS RECENTLY AT HOME:  Tylox and Levaquin.   ALLERGIES:  PENICILLIN.   REVIEW OF SYSTEMS:  A 15 system review was completed.  It is positive  for the wound pain and  drainage as noted above, otherwise from a  gastrointestinal standpoint, she has been eating and moving her bowels  and passing gas without difficulty.  The remainder of the system review  was negative.   PHYSICAL EXAMINATION:  VITAL SIGNS:  Temperature 98.1, blood pressure  122/74, heart rate 82, respirations 20.  GENERAL:  She is awake, alert and very anxious.  HEENT:  Pupils are equal.  Sclerae is clear.  NECK:  Supple with no masses.  LUNGS:  Are clear to auscultation.  No significant wheezing is present.  Respiratory excursion is good.  HEART:  Regular with no murmur.  ABDOMEN:  Soft with active bowel sounds.  She has some purulent  discharge from a small sinus of the central portion of her wound.  Her  wound is closed with interrupted sutures.  The purulent discharge does  have a foul order.  She has a small amount of erythema inferior to the  central portion of the wound.  SKIN:  Warm and  dry with no rashes.   PROCEDURE:  The patient's wound was prepped and draped in sterile  fashion.  The central portion of the wound was opened by removing the  sutures.  About 150-200 mL of cloudy purulent discharge was removed.  Cultures have been sent by emergency department staff.  The wound was  irrigated out thoroughly and packed with wet to dry sterile gauze  dressing.  The patient tolerated this well.   IMPRESSION:  Wound infection after small-bowel obstruction.  We will  admit her to the hospital.  The wound was incised and drained as above.  We will begin twice a day dressing changes.  I will place her on Cipro  and Flagyl IV.  We will also check a CT scan of the abdomen and pelvis  to rule out intra-abdominal abscess or evidence of small bowel fistula.  Plan was discussed in detail with the patient and her aunt.  Questions  were answered.      Gabrielle Dare Janee Morn, M.D.  Electronically Signed     BET/MEDQ  D:  02/03/2006  T:  02/04/2006  Job:  [id number]   cc:   Dalia Heading,  M.D.  Annia Friendly. Loleta Chance, MD

## 2010-07-21 NOTE — Discharge Summary (Signed)
NAME:  [name], [name] NO.:  [id number]   MEDICAL RECORD NO.:  [id number]          PATIENT TYPE:  INP   LOCATION:  A303                          FACILITY:  [facility]   PHYSICIAN:  Dalia Heading, M.D.  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 Dr. Malvin Johns was covering for me and  saw the patient in the Emergency Room. She was noted to have a wound  infection and he opened the wound to drain the pus. She was subsequently  placed on ciprofloxacin and Flagyl. Cultures revealed mixed bacteria but no  Staphylococcus or Streptococcus bacteria were cultured. The patient  defervesced nicely. Her wound cleaned up well with dressing changes. She  subsequently was taken to the Operating Room on 01/23/2006 and underwent  secondary closure of her abdominal wound. Her fascia was noted to be intact.  She tolerated the procedure well.   The patient is being discharged home on 01/25/2006 in good and stable  condition.   DISCHARGE INSTRUCTIONS:  The patient is to follow up with Dr. Franky Macho  on 01/31/2006.   DISCHARGE MEDICATIONS:  1. Tylox 1 tablet p.o. q.4 h. p.r.n. pain.  2. Ambien 10 mg p.o. nightly p.r.n. insomnia.  3. Levaquin 500 mg p.o. daily for 10 days.   PRINCIPAL DIAGNOSES:  1. Wound infection.  2. Status post partial small bowel resection.  3. Depression.  4. Chronic back pain.   PRINCIPAL PROCEDURE:  Secondary closure of abdominal wound on 01/23/2006.      Dalia Heading, M.D.  Electronically Signed     MAJ/MEDQ  D:  01/25/2006  T:  01/25/2006  Job:  [id number]
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
## 2010-07-21 NOTE — Op Note (Signed)
NAMEVERYL, ABRIL NO.:  [id number]   MEDICAL RECORD NO.:  [id number]          PATIENT TYPE:  INP   LOCATION:  A303                          FACILITY:  [facility]   PHYSICIAN:  Dalia Heading, M.D.  DATE OF BIRTH:  [birth year]   DATE OF PROCEDURE:  01/23/2006  DATE OF DISCHARGE:                                 OPERATIVE REPORT   PREOPERATIVE DIAGNOSIS:  Wound dehiscence.   POSTOPERATIVE DIAGNOSIS:  Wound dehiscence.   PROCEDURE:  Closure of abdominal wound dehiscence.   SURGEON:  Dr. Franky Macho.   ANESTHESIA:  General.   INDICATIONS:  The patient is a 45 year old black female who is approximately  2-1/2 week status post a partial small-bowel resection for intussusception  and a benign small bowel tumor who presented five days ago with a wound  infection.  This was opened in the emergency room.  A mixed bacteria was  found.  The patient was placed on antibiotics and admitted for wound care.  The wound has cleaned up nicely and the fascia was intact.  The patient now  comes to the operating room for closure of the abdominal wound.  Risks and  benefits of the procedure were fully explained to the patient, who gave  informed consent.   PROCEDURE NOTE:  The patient was placed in supine position.  After general  anesthesia was administered, the abdomen was prepped and draped in the usual  sterile technique with Betadine.  Surgical site confirmation was performed.   The wound was inspected and no purulent drainage was present.  The fascia  was noted be intact.  The wound was irrigated with gentamicin and normal  saline.  The skin edges were then reapproximated using 2-0 Prolene vertical  mattress sutures.  Several subcutaneous 2-0 Vicryl sutures were also placed.  Betadine ointment and dry sterile dressing were applied.  An abdominal  binder was also applied.   All tape and needle counts were correct at the end the procedure.  The  patient was awakened and  transferred to PACU in stable condition.   COMPLICATIONS:  None.   SPECIMEN:  None.   BLOOD LOSS:  Minimal.      Dalia Heading, M.D.  Electronically Signed     MAJ/MEDQ  D:  01/23/2006  T:  01/23/2006  Job:  [id number]

## 2010-07-21 NOTE — Op Note (Signed)
NAMEALTAGRACIA, [name] NO.:  [id number]   MEDICAL RECORD NO.:  [id number]          PATIENT TYPE:  INP   LOCATION:  A303                          FACILITY:  [facility]   PHYSICIAN:  Dalia Heading, M.D.  DATE OF BIRTH:  1965-05-02   DATE OF PROCEDURE:  01/11/2006  DATE OF DISCHARGE:                                 OPERATIVE REPORT   PREOPERATIVE DIAGNOSIS:  Small-bowel obstruction, intussusception.   POSTOPERATIVE DIAGNOSIS:  Small-bowel obstruction, intussusception.   PROCEDURE:  Partial small-bowel resection.   SURGEON:  Dr. Franky Macho   ASSISTANT:  Dr. Arna Snipe   ANESTHESIA:  General endotracheal.   INDICATIONS:  The patient is a 45 year old black female who presented with a  partial small-bowel obstruction secondary to an intussusception.  This did  not resolve after 24 hours of conservative therapy, thus was elected to  proceed with exploratory laparotomy, possible small bowel resection.  The  risks and benefits of the procedure including bleeding, infection, and the  possibility of a resection were fully explained to the patient, gave  informed consent.   PROCEDURE NOTE:  The patient was placed in the supine position.  After  induction of general endotracheal anesthesia, the abdomen was prepped and  draped using the sterile technique with Betadine.  Surgical site  confirmation was performed.   A midline incision was made.  The peritoneal cavity was entered into without  difficulty.  A small amount of clear free fluid was found.  The nasogastric  tube was noted be in its appropriate position in the stomach.  The liver was  inspected and noted to be within normal limits.  The small bowel was  eviscerated and in the proximal small bowel, the intussusception was found.  This was reduced, and a large mass was noted within the lumen of the small  bowel.  Approximately 1-2 feet of small bowel was resected in a wedge-like  manner using GIA staplers.  The  mesentery was divided using the harmonic  scalpel.  The small bowel was inspected off the operating room table and a  golf-ball size submucosal mass was present.  This specimen was sent to  pathology for further examination.  A side-to-side enteroenterotomy was then  performed using a GIA 50 stapler.  The enterotomy was closed using a TA-60  stapler.  The staple line was bolstered using 3-0 silk Lambert sutures.  The  mesenteric defect was closed using a 2-0 chromic gut running suture.  The  bowel was returned in the abdominal cavity in orderly fashion.  The  abdominal cavity was then copiously irrigated with normal saline.  The  fascia was reapproximated using a looped 0 Novofil running suture.  The  subcutaneous layer was irrigated with normal saline, and the skin was closed  using staples.  Betadine ointment and dry sterile dressing were applied.   All tape and needle counts correct at the end of the procedure.  The patient  was extubated in the operating room and went back to recovery room awake in  stable condition.   COMPLICATIONS:  None.   SPECIMEN:  Small  bowel, partial.   BLOOD LOSS:  75 mL.      Dalia Heading, M.D.  Electronically Signed     MAJ/MEDQ  D:  01/11/2006  T:  01/12/2006  Job:  [id number]   cc:   Kassie Mends, M.D.  [address]  [place] , [place] [address]   Annia Friendly. Loleta Chance, MD  Fax: [phone]

## 2010-07-21 NOTE — H&P (Signed)
[facility]  Patient:    [name], [name]                        MRN: [id number] Adm. Date:  [id number] Attending:  Thyra Breed CC:         Fuller Canada, M.D., [phone] , [place]   History and Physical  HISTORY OF PRESENT ILLNESS:  The patient is a 45 year old who is sent to [place] by Dr. Romeo Apple for epidural steroid injections.  The patient was seen back on November 16, and at that time did not want to have an injection.  She presents today having changed her mind and is interested in an injection.  She is fully aware of the potential risk.  Her symptoms are unchanged from previously.  PHYSICAL EXAMINATION:  GENERAL APPEARANCE:  VITAL SIGNS:  Blood pressure 133/56, heart rate is 82, respiratory rate is 20, O2 saturation is 100%, pain level 6/10.  NEUROLOGIC:  Grossly unchanged.  PROCEDURE:  After informed consent was obtained, the patient was placed in a sitting position and monitored.  Her back was prepped with Betadine x 3.  Skin was raised at the L4-L5 interspace with 1% lidocaine.  A 20-Gauge Tuohy needle was introduced into the interspinous ligament, and the patient became vasovagal characterized by nausea, sweating, and a heart rate down to 35.  The needle was removed, and she was allowed to lie on her side and recover from this.  Following the recovery, the patient had no interest in proceeding with the procedure.  I advised her that she would probably do just fine to proceed at this time if she wished to, but she has too many reservations and wishes not proceed with the avenue.  I advised her that she might discuss conditioning and physical therapy followed by work hardening and see whether this would help with her back with Dr. Loleta Chance or Dr. Romeo Apple.  I advised her to that I would be happy to see her back to try another epidural if she is interested. DD:  02/13/00 TD:  02/13/00 Job: [id number]

## 2010-07-21 NOTE — Op Note (Signed)
[name], [name] NO.:  [id number]   MEDICAL RECORD NO.:  [id number]          PATIENT TYPE:  INP   LOCATION:  5743                         FACILITY:  [facility]   PHYSICIAN:  Sandria Bales. Ezzard Standing, M.D.  DATE OF BIRTH:  08/18/1965   DATE OF PROCEDURE:  02/04/2006  DATE OF DISCHARGE:                               OPERATIVE REPORT   PREOPERATIVE DIAGNOSIS:  Nonhealing wound with evidence of subcutaneous  loculation on CT scan.   POSTOPERATIVE DIAGNOSIS:  Subcutaneous abscess/loculation.   PROCEDURE:  Incision, drainage and opening of her abdominal wound.   SURGEON:  Sandria Bales. Ezzard Standing, M.D.   HISTORY OF ILLNESS:  [name] is a 45 year old black female, who  had a small bowel resection by Dr. Franky Macho at [facility] on 14 January 2006, for an intussusception.   She had a wound infection requiring opening her abdominal incision.  This was managed on an open basis.  Then, Dr. Lovell Sheehan took her back to  the operating room on 21 November for delayed primary closure of this  wound.  Since that time, she has had increasing abdominal/incisional  pain with drainage with drainage.  She came down to [facility]  for further management.   She was hysterical, sobbing in the bed, and I really could not get near  her abdominal wound.  There was no way I could explore this locally.  Subsequently, I brought her to the operating room.  I discussed with her  that she will probably have to have the wound opened and packed and I  would not try another delayed primary closure on her.   OPERATIVE NOTE:  Patient placed in a supine position.  Her abdomen was  prepped with Betadine solution and sterilely draped.  I opened the wound  widely.  Basically, the whole wound had become necrotic and broken down  with subcutaneous pockets, which were dissecting out laterally to the  left of the incision.  I irrigated this was saline, packed it with  Kerlix gauze soaked in saline.   She will start dressing changes tomorrow, and, hopefully, we can do it  at the bedside.      Sandria Bales. Ezzard Standing, M.D.  Electronically Signed     DHN/MEDQ  D:  02/04/2006  T:  02/05/2006  Job:  [id number]   cc:   Dalia Heading, M.D.

## 2010-07-21 NOTE — Consult Note (Signed)
NAME:  [name], [name] NO.:  [id number]   MEDICAL RECORD NO.:  [id number]          PATIENT TYPE:  OBV   LOCATION:  A303                          FACILITY:  [facility]   PHYSICIAN:  Barbaraann Barthel, M.D. DATE OF BIRTH:  03-31-1965   DATE OF CONSULTATION:  01/19/2006  DATE OF DISCHARGE:                                 CONSULTATION   NOTE:  Surgery was asked to see this 45 year old black female who is  status post small-bowel resection on January 11, 2006, for obstruction.  She states that she had some drainage noted and pain and erythema around  her surgical incision.  She called me and I advised her to come to the  emergency room.  In essence, she had an extensive wound infection with  cellulitis.   TREATMENT RENDERED:  Surgical clips were removed.  The wound was  irrigated.  I evacuated approximately two cups full, at least, of pus.  This was cultured.  I examined the fascia which was intact.  After  irrigating this copiously with normal saline solution, I packed the  wound open with 2 inch Kerlix roll gauze and applied Montgomery strap  dressings.   This patient will be admitted to Dr. Lovell Sheehan' service for observation.  I am covering for him and he should be here to see this patient within  the next 24 hours.  She has considerable discomfort, a 19,000, white  count, and we will initiate antibiotics.   PHYSICAL EXAMINATION:  GENERAL:  Otherwise she is obviously acutely  uncomfortable.  VITAL SIGNS:  Her temperature was 97.8, pulse rate was 74 per minute,  blood pressure was 114/52, respirations 18 per minute.  HEENT:  Head is normocephalic.  Eyes:  Extraocular movements are intact.  Pupils were round and react to light and accommodation.  There is can no  conjunctival pallor or scleral injection.  Sclerae is normal tincture.  LUNGS:  Clear both anterior and posterior auscultation.  HEART:  Regular rhythm.  BREASTS/AXILLA:  Without masses.  ABDOMEN:  The  patient's surgical wound is as mentioned above.  This was  attended to and packed open as mentioned above.  Abdomen otherwise was  soft.  EXTREMITIES:  Otherwise within normal limits.   Other medical problems include depression and chronic back pain.   Other past surgeries include an appendectomy.  She had had some  attempted back surgery and also will surgery on her right arm.   MEDICATIONS:  She takes promethazine, omeprazole, hydrocodone, Celebrex,  Lidoderm, Maxalt, Combivent, and Cymbalta, and Seroquel.   SHE IS ALLERGIC TO PENICILLIN.   REVIEW OF SYSTEMS:  GI symptoms:  The patient was eating well without  any problem and also moving her bowels.  She has had no nausea or  vomiting.  GU:  No dysuria.  MUSCULOSKELETAL:  As mentioned above.  She has had  chronic back pain for which she takes Lidoderm patches etc.  NEUROLOGIC:  The patient has chronic depression for which she takes Cymbalta and  Seroquel.  She also has problems with migraines.  ENDOCRINE:  No history  of diabetes or thyroid disease.  She also has a history of tobacco abuse.  She denies any alcohol abuse.   IMPRESSION:  Extensive wound infection with cellulitis.   PLAN:  1. We will admit her to observation.  2. Wound care has been carried out by me in the emergency room and she      should do well from this.  3. She will obviously need extensive wound care and some wound      instructions either to her husband to handle this at home and      coordinate efforts with home health care if that can be arranged      for her.  4. I will go ahead and admit this for Dr. Lovell Sheehan.      Barbaraann Barthel, M.D.  Electronically Signed     WB/MEDQ  D:  01/19/2006  T:  01/20/2006  Job:  [id number]   cc:   Dalia Heading, M.D.  Fax: [phone]

## 2010-07-24 ENCOUNTER — Inpatient Hospital Stay: Admission: RE | Admit: 2010-07-24 | Payer: Self-pay | Source: Ambulatory Visit

## 2010-08-16 ENCOUNTER — Inpatient Hospital Stay: Admission: RE | Admit: 2010-08-16 | Payer: Self-pay | Source: Ambulatory Visit

## 2010-08-18 ENCOUNTER — Ambulatory Visit
Admission: RE | Admit: 2010-08-18 | Discharge: 2010-08-18 | Disposition: A | Discharge status: Home / Self Care | Payer: Medicare Other | Source: Ambulatory Visit | Attending: General Surgery | Admitting: General Surgery

## 2010-08-18 DIAGNOSIS — Z1231 Encounter for screening mammogram for malignant neoplasm of breast: Secondary | ICD-10-CM

## 2010-08-31 ENCOUNTER — Telehealth (INDEPENDENT_AMBULATORY_CARE_PROVIDER_SITE_OTHER): Payer: Self-pay | Admitting: General Surgery

## 2010-08-31 NOTE — Telephone Encounter (Signed)
[name] called and requested percocet and oxycodone. Dr Nada Libman said he will not fill her pain meds until he gets the results of her test in two weeks.

## 2010-09-01 ENCOUNTER — Other Ambulatory Visit (INDEPENDENT_AMBULATORY_CARE_PROVIDER_SITE_OTHER): Payer: Self-pay | Admitting: General Surgery

## 2010-09-01 MED ORDER — OXYCODONE-ACETAMINOPHEN 7.5-500 MG PO TABS: 2.0000 | ORAL_TABLET | Freq: Four times a day (QID) | ORAL | Status: DC | PRN

## 2010-09-01 MED ORDER — OXYCODONE HCL 5 MG PO TABS: 10.0000 mg | ORAL_TABLET | Freq: Three times a day (TID) | ORAL | Status: DC | PRN

## 2010-09-11 ENCOUNTER — Telehealth (INDEPENDENT_AMBULATORY_CARE_PROVIDER_SITE_OTHER): Payer: Self-pay | Admitting: General Surgery

## 2010-09-13 ENCOUNTER — Other Ambulatory Visit (INDEPENDENT_AMBULATORY_CARE_PROVIDER_SITE_OTHER): Payer: Self-pay | Admitting: General Surgery

## 2010-09-13 MED ORDER — OXYCODONE-ACETAMINOPHEN 7.5-500 MG PO TABS: 1.0000 | ORAL_TABLET | Freq: Four times a day (QID) | ORAL | Status: AC | PRN

## 2010-09-14 ENCOUNTER — Telehealth (INDEPENDENT_AMBULATORY_CARE_PROVIDER_SITE_OTHER): Payer: Self-pay

## 2010-09-14 NOTE — Telephone Encounter (Signed)
Audra Kagel called and is wanting the Roxicodone 5mg  percription.

## 2010-09-15 ENCOUNTER — Other Ambulatory Visit (INDEPENDENT_AMBULATORY_CARE_PROVIDER_SITE_OTHER): Payer: Self-pay | Admitting: General Surgery

## 2010-09-15 MED ORDER — OXYCODONE HCL 5 MG PO TABS: 10.0000 mg | ORAL_TABLET | Freq: Three times a day (TID) | ORAL | Status: DC | PRN

## 2010-09-20 ENCOUNTER — Encounter (INDEPENDENT_AMBULATORY_CARE_PROVIDER_SITE_OTHER): Payer: Medicare Other | Admitting: General Surgery

## 2010-09-21 ENCOUNTER — Telehealth (INDEPENDENT_AMBULATORY_CARE_PROVIDER_SITE_OTHER): Payer: Self-pay | Admitting: General Surgery

## 2010-09-25 ENCOUNTER — Other Ambulatory Visit (INDEPENDENT_AMBULATORY_CARE_PROVIDER_SITE_OTHER): Payer: Self-pay | Admitting: General Surgery

## 2010-09-25 MED ORDER — OXYCODONE-ACETAMINOPHEN 7.5-500 MG PO TABS: 2.0000 | ORAL_TABLET | Freq: Four times a day (QID) | ORAL | Status: DC | PRN

## 2010-09-26 ENCOUNTER — Other Ambulatory Visit (INDEPENDENT_AMBULATORY_CARE_PROVIDER_SITE_OTHER): Payer: Self-pay | Admitting: General Surgery

## 2010-10-04 ENCOUNTER — Encounter (INDEPENDENT_AMBULATORY_CARE_PROVIDER_SITE_OTHER): Payer: Medicare Other | Admitting: General Surgery

## 2010-10-08 ENCOUNTER — Other Ambulatory Visit (INDEPENDENT_AMBULATORY_CARE_PROVIDER_SITE_OTHER): Payer: Self-pay | Admitting: General Surgery

## 2010-10-08 MED ORDER — OXYCODONE-ACETAMINOPHEN 7.5-500 MG PO TABS: 2.0000 | ORAL_TABLET | Freq: Four times a day (QID) | ORAL | Status: AC | PRN

## 2010-10-20 ENCOUNTER — Encounter (INDEPENDENT_AMBULATORY_CARE_PROVIDER_SITE_OTHER): Payer: Self-pay | Admitting: General Surgery

## 2010-10-25 ENCOUNTER — Encounter (INDEPENDENT_AMBULATORY_CARE_PROVIDER_SITE_OTHER): Payer: Medicare Other | Admitting: General Surgery

## 2010-10-27 ENCOUNTER — Telehealth (INDEPENDENT_AMBULATORY_CARE_PROVIDER_SITE_OTHER): Payer: Self-pay

## 2010-10-27 NOTE — Telephone Encounter (Signed)
I received a call from [name] stating she didn't know her appt was Wednesday.  She was a no show.  I told her I gave the appt to her myself and she was supposed to write it down.  She also just got dismissed from the pain clinic because she was 10 minutes late.  Apparently that was the 3rd time it happened.  I offered our next available appointment of 9-19.  She wants to be seen soon and get refill on pain med.  I told her I can't reach Dr Janee Morn today but I will let him know Monday.  She wants Oxycodone 15mg  instead of 7.5.  She states the pain clinic only gave her 120 pills a month instead of 240 so she is out.  She is also requesting a referral to the pain clinic at [facility].  She sees Dr Jacquenette Shone in the next couple of weeks for MRI

## 2010-10-31 ENCOUNTER — Other Ambulatory Visit (INDEPENDENT_AMBULATORY_CARE_PROVIDER_SITE_OTHER): Payer: Self-pay | Admitting: General Surgery

## 2010-11-01 ENCOUNTER — Encounter (INDEPENDENT_AMBULATORY_CARE_PROVIDER_SITE_OTHER): Payer: Self-pay

## 2010-11-01 ENCOUNTER — Other Ambulatory Visit (INDEPENDENT_AMBULATORY_CARE_PROVIDER_SITE_OTHER): Payer: Self-pay | Admitting: General Surgery

## 2010-11-01 MED ORDER — OXYCODONE HCL 5 MG PO TABS: 15.0000 mg | ORAL_TABLET | Freq: Four times a day (QID) | ORAL | Status: DC | PRN

## 2010-11-07 ENCOUNTER — Telehealth (INDEPENDENT_AMBULATORY_CARE_PROVIDER_SITE_OTHER): Payer: Self-pay

## 2010-11-07 NOTE — Telephone Encounter (Signed)
Pt is requesting a refill on her pain med.  She has 6 or 7 left.  She is seeing the [facility] 10/4 and has an appointment with Dr Janee Morn 9-19

## 2010-11-08 ENCOUNTER — Other Ambulatory Visit (INDEPENDENT_AMBULATORY_CARE_PROVIDER_SITE_OTHER): Payer: Self-pay | Admitting: General Surgery

## 2010-11-08 MED ORDER — OXYCODONE HCL 5 MG PO TABS: 15.0000 mg | ORAL_TABLET | Freq: Four times a day (QID) | ORAL | Status: DC | PRN

## 2010-11-08 NOTE — Telephone Encounter (Signed)
Refill done. Patient referred to [facility] pain clinic

## 2010-11-08 NOTE — Telephone Encounter (Signed)
Patient called requesting an appointment with Dr. Janee Morn today, I informed patient that there's no appointment's available for today.  Patient states she's having abdominal pain and is currently out of her pain medication (Oxycodone) and need's a refill.

## 2010-11-22 ENCOUNTER — Ambulatory Visit (INDEPENDENT_AMBULATORY_CARE_PROVIDER_SITE_OTHER): Payer: Medicare Other | Admitting: General Surgery

## 2010-11-22 ENCOUNTER — Encounter (INDEPENDENT_AMBULATORY_CARE_PROVIDER_SITE_OTHER): Payer: Self-pay | Admitting: General Surgery

## 2010-11-22 VITALS — BP 128/96 | HR 60 | Temp 96.2°F | Resp 20 | Ht 72.0 in | Wt 234.4 lb

## 2010-11-22 DIAGNOSIS — G894 Chronic pain syndrome: Secondary | ICD-10-CM

## 2010-11-22 DIAGNOSIS — K432 Incisional hernia without obstruction or gangrene: Secondary | ICD-10-CM

## 2010-11-22 MED ORDER — OXYCODONE HCL 5 MG PO TABS: 15.0000 mg | ORAL_TABLET | Freq: Four times a day (QID) | ORAL | Status: DC | PRN

## 2010-11-22 NOTE — Progress Notes (Signed)
Subjective:     Patient ID: [name], female   DOB: 12-18-1965, 45 y.o.   MRN: [id number]  HPI Patient is status post repair of incisional hernia by Dr.Pappas at [facility]. She continues to have chronic pain and has been taking oxycodone. She was released from Dr.Spivey's pain management practice due to visit no shows. She is still having some pain in her abdominal wall musculature. She is set to see the pain clinic at [facility] early next month she is also going to see Dr. Jacquenette Shone the same day. Follow up CT scan will be done prior to that visit  Review of Systems     Objective:   Physical Exam  Eyes: Conjunctivae are normal. Pupils are equal, round, and reactive to light.  Neck: Normal range of motion. Neck supple.  Cardiovascular: Normal rate and regular rhythm.   Pulmonary/Chest: Effort normal. She has no wheezes. She has no rales.  Abdominal: Soft. She exhibits no distension and no mass. There is no guarding.  Hernia repair feels intact. No masses are felt. There is minimal muscular tenderness on the left side.     Assessment:     Post repair of incisional hernia. Chronic pain.    Plan:     Followup as scheduled as listed above.Refill her oxycodone. We'll see her back in 2 months.

## 2010-12-06 ENCOUNTER — Other Ambulatory Visit (INDEPENDENT_AMBULATORY_CARE_PROVIDER_SITE_OTHER): Payer: Self-pay

## 2010-12-06 DIAGNOSIS — K469 Unspecified abdominal hernia without obstruction or gangrene: Secondary | ICD-10-CM

## 2010-12-06 MED ORDER — OXYCODONE HCL 5 MG PO TABS: 15.0000 mg | ORAL_TABLET | Freq: Four times a day (QID) | ORAL | Status: DC | PRN

## 2010-12-06 NOTE — Telephone Encounter (Signed)
Called pt to let her know refill for Oxycodone 5mg  is at the front desk for her to pick up.  Rx authorized by Dr. Janee Morn and signed by Dr. Ezzard Standing.

## 2010-12-20 ENCOUNTER — Telehealth (INDEPENDENT_AMBULATORY_CARE_PROVIDER_SITE_OTHER): Payer: Self-pay

## 2010-12-20 NOTE — Telephone Encounter (Signed)
Patient requested refill on pain meds.  She did not make it to her pain clinic appointment.

## 2010-12-21 ENCOUNTER — Other Ambulatory Visit (INDEPENDENT_AMBULATORY_CARE_PROVIDER_SITE_OTHER): Payer: Self-pay | Admitting: General Surgery

## 2010-12-21 DIAGNOSIS — K469 Unspecified abdominal hernia without obstruction or gangrene: Secondary | ICD-10-CM

## 2010-12-21 MED ORDER — OXYCODONE HCL 5 MG PO TABS: 15.0000 mg | ORAL_TABLET | Freq: Four times a day (QID) | ORAL | Status: AC | PRN

## 2010-12-21 NOTE — Telephone Encounter (Signed)
Rx written.

## 2010-12-21 NOTE — Telephone Encounter (Signed)
Notified pt the prescription is ready.

## 2010-12-29 ENCOUNTER — Telehealth (INDEPENDENT_AMBULATORY_CARE_PROVIDER_SITE_OTHER): Payer: Self-pay

## 2010-12-29 NOTE — Telephone Encounter (Signed)
I got a message back from [place] at [facility].  She states the patient has had 3 cancellations with their office and multiple no shows/cancellations  in the [facility] system.  They will not be rescheduling the pt.  I called Jalayne back and informed her that per Dr Janee Morn he will not refill the medicine because it was supposed to last until she saw the pain clinic and he will not do another referral to a closer pain clinic.  I advised she can talk to her medical dr or seek advice from Dr Jacquenette Shone who did her surgery.

## 2010-12-29 NOTE — Telephone Encounter (Signed)
Patient called and wants her pain med refilled.  She would like to be referred to another pain clinic because she has been a no show or cancelled too many times at [facility] pain clinic with Dr Azzie Glatter.  I called [phone] to confirm if she has been discharged but I had to leave a voicemail for the nurse.  I notified Dr Janee Morn.  She just got her pain med last week on the 18th.

## 2011-01-03 ENCOUNTER — Encounter (INDEPENDENT_AMBULATORY_CARE_PROVIDER_SITE_OTHER): Payer: Self-pay

## 2011-01-04 ENCOUNTER — Telehealth (INDEPENDENT_AMBULATORY_CARE_PROVIDER_SITE_OTHER): Payer: Self-pay

## 2011-01-04 NOTE — Telephone Encounter (Signed)
Patient called today and states she sees Dr Loleta Chance next week and she wants me to ask Dr Janee Morn if he can write enough pain medicine to hold her until then.  I advised that I can't because I know Dr Janee Morn will not write for the meds.  He declined last week to continue to write for them.  I informed her we cancelled her appointment for follow up in our office and she will get a letter in the mail about this.

## 2011-01-17 ENCOUNTER — Encounter (INDEPENDENT_AMBULATORY_CARE_PROVIDER_SITE_OTHER): Payer: Medicare Other | Admitting: General Surgery

## 2011-04-11 NOTE — Telephone Encounter (Signed)
error 

## 2011-05-18 ENCOUNTER — Ambulatory Visit: Payer: Medicare Other | Admitting: Family Medicine

## 2012-02-07 ENCOUNTER — Emergency Department (HOSPITAL_COMMUNITY)
Admission: EM | Admit: 2012-02-07 | Discharge: 2012-02-07 | Disposition: A | Discharge status: Home / Self Care | Payer: Medicare Other | Attending: Emergency Medicine | Admitting: Emergency Medicine

## 2012-02-07 ENCOUNTER — Emergency Department (HOSPITAL_COMMUNITY): Payer: Medicare Other

## 2012-02-07 ENCOUNTER — Encounter (HOSPITAL_COMMUNITY): Payer: Self-pay

## 2012-02-07 ENCOUNTER — Other Ambulatory Visit: Payer: Self-pay

## 2012-02-07 DIAGNOSIS — M5090 Cervical disc disorder, unspecified, unspecified cervical region: Secondary | ICD-10-CM | POA: Insufficient documentation

## 2012-02-07 DIAGNOSIS — M62838 Other muscle spasm: Secondary | ICD-10-CM

## 2012-02-07 DIAGNOSIS — Y9289 Other specified places as the place of occurrence of the external cause: Secondary | ICD-10-CM | POA: Insufficient documentation

## 2012-02-07 DIAGNOSIS — Z79899 Other long term (current) drug therapy: Secondary | ICD-10-CM | POA: Insufficient documentation

## 2012-02-07 DIAGNOSIS — Z862 Personal history of diseases of the blood and blood-forming organs and certain disorders involving the immune mechanism: Secondary | ICD-10-CM | POA: Insufficient documentation

## 2012-02-07 DIAGNOSIS — K589 Irritable bowel syndrome without diarrhea: Secondary | ICD-10-CM | POA: Insufficient documentation

## 2012-02-07 DIAGNOSIS — F17200 Nicotine dependence, unspecified, uncomplicated: Secondary | ICD-10-CM | POA: Insufficient documentation

## 2012-02-07 DIAGNOSIS — R55 Syncope and collapse: Secondary | ICD-10-CM | POA: Insufficient documentation

## 2012-02-07 DIAGNOSIS — W1789XA Other fall from one level to another, initial encounter: Secondary | ICD-10-CM | POA: Insufficient documentation

## 2012-02-07 DIAGNOSIS — Y9389 Activity, other specified: Secondary | ICD-10-CM | POA: Insufficient documentation

## 2012-02-07 DIAGNOSIS — B659 Schistosomiasis, unspecified: Secondary | ICD-10-CM | POA: Insufficient documentation

## 2012-02-07 DIAGNOSIS — Z8669 Personal history of other diseases of the nervous system and sense organs: Secondary | ICD-10-CM | POA: Insufficient documentation

## 2012-02-07 DIAGNOSIS — I10 Essential (primary) hypertension: Secondary | ICD-10-CM | POA: Insufficient documentation

## 2012-02-07 DIAGNOSIS — Z8719 Personal history of other diseases of the digestive system: Secondary | ICD-10-CM | POA: Insufficient documentation

## 2012-02-07 DIAGNOSIS — S46909A Unspecified injury of unspecified muscle, fascia and tendon at shoulder and upper arm level, unspecified arm, initial encounter: Secondary | ICD-10-CM | POA: Insufficient documentation

## 2012-02-07 DIAGNOSIS — S4980XA Other specified injuries of shoulder and upper arm, unspecified arm, initial encounter: Secondary | ICD-10-CM | POA: Insufficient documentation

## 2012-02-07 DIAGNOSIS — M5380 Other specified dorsopathies, site unspecified: Secondary | ICD-10-CM | POA: Insufficient documentation

## 2012-02-07 DIAGNOSIS — E785 Hyperlipidemia, unspecified: Secondary | ICD-10-CM | POA: Insufficient documentation

## 2012-02-07 DIAGNOSIS — R51 Headache: Secondary | ICD-10-CM | POA: Insufficient documentation

## 2012-02-07 DIAGNOSIS — G8929 Other chronic pain: Secondary | ICD-10-CM | POA: Insufficient documentation

## 2012-02-07 DIAGNOSIS — IMO0002 Reserved for concepts with insufficient information to code with codable children: Secondary | ICD-10-CM | POA: Insufficient documentation

## 2012-02-07 DIAGNOSIS — R404 Transient alteration of awareness: Secondary | ICD-10-CM | POA: Insufficient documentation

## 2012-02-07 DIAGNOSIS — Z7982 Long term (current) use of aspirin: Secondary | ICD-10-CM | POA: Insufficient documentation

## 2012-02-07 LAB — COMPREHENSIVE METABOLIC PANEL
ALT: 15 U/L (ref 0–35)
AST: 21 U/L (ref 0–37)
Albumin: 4 g/dL (ref 3.5–5.2)
Alkaline Phosphatase: 73 U/L (ref 39–117)
BUN: 14 mg/dL (ref 6–23)
CO2: 26 mEq/L (ref 19–32)
Calcium: 9.6 mg/dL (ref 8.4–10.5)
Chloride: 103 mEq/L (ref 96–112)
Creatinine, Ser: 0.66 mg/dL (ref 0.50–1.10)
GFR calc Af Amer: >90 mL/min (ref >90)
GFR calc non Af Amer: >90 mL/min (ref >90)
Glucose, Bld: 113 mg/dL — ABNORMAL HIGH (ref 70–99)
Potassium: 3.4 mEq/L — ABNORMAL LOW (ref 3.5–5.1)
Sodium: 141 mEq/L (ref 135–145)
Total Bilirubin: 0.3 mg/dL (ref 0.3–1.2)
Total Protein: 7.6 g/dL (ref 6.0–8.3)

## 2012-02-07 LAB — GLUCOSE, CAPILLARY: Glucose-Capillary: 110 mg/dL — ABNORMAL HIGH (ref 70–99)

## 2012-02-07 LAB — CBC WITH DIFFERENTIAL/PLATELET
Basophils Absolute: 0 10*3/uL (ref 0.0–0.1)
Basophils Relative: 0 % (ref 0–1)
Eosinophils Absolute: 0.1 10*3/uL (ref 0.0–0.7)
Eosinophils Relative: 1 % (ref 0–5)
HCT: 41.5 % (ref 36.0–46.0)
Hemoglobin: 14.4 g/dL (ref 12.0–15.0)
Lymphocytes Relative: 22 % (ref 12–46)
Lymphs Abs: 2.3 10*3/uL (ref 0.7–4.0)
MCH: 30.1 pg (ref 26.0–34.0)
MCHC: 34.7 g/dL (ref 30.0–36.0)
MCV: 86.8 fL (ref 78.0–100.0)
Monocytes Absolute: 0.6 10*3/uL (ref 0.1–1.0)
Monocytes Relative: 6 % (ref 3–12)
Neutro Abs: 7.3 10*3/uL (ref 1.7–7.7)
Neutrophils Relative %: 70 % (ref 43–77)
Platelets: 313 10*3/uL (ref 150–400)
RBC: 4.78 MIL/uL (ref 3.87–5.11)
RDW: 12.7 % (ref 11.5–15.5)
WBC: 10.4 10*3/uL (ref 4.0–10.5)

## 2012-02-07 LAB — POCT I-STAT TROPONIN I: Troponin i, poc: 0 ng/mL (ref 0.00–0.08)

## 2012-02-07 MED ORDER — ONDANSETRON 4 MG PO TBDP
4.0000 mg | ORAL_TABLET | Freq: Once | ORAL | Status: AC
  Administered 2012-02-07: 4 mg via ORAL
  Filled 2012-02-07: qty 1

## 2012-02-07 MED ORDER — CYCLOBENZAPRINE HCL 10 MG PO TABS: 10.0000 mg | ORAL_TABLET | Freq: Two times a day (BID) | ORAL | Status: DC | PRN

## 2012-02-07 MED ORDER — OXYCODONE-ACETAMINOPHEN 7.5-325 MG PO TABS: 1.0000 | ORAL_TABLET | ORAL | Status: DC | PRN

## 2012-02-07 MED ORDER — HYDROMORPHONE HCL PF 2 MG/ML IJ SOLN
2.0000 mg | Freq: Once | INTRAMUSCULAR | Status: AC
  Administered 2012-02-07: 2 mg via INTRAMUSCULAR
  Filled 2012-02-07: qty 1

## 2012-02-07 NOTE — ED Notes (Signed)
Patient transported to CT 

## 2012-02-07 NOTE — ED Notes (Signed)
Reports fell 3 days, c/o left side neck pain raditing into left shoulder blade area & down into left arm. Tingling LUE. Pain steadily worsening. Increased pain with LUE & neck mvmt.

## 2012-02-07 NOTE — ED Notes (Signed)
NAD noted at time of d/c home with family 

## 2012-02-07 NOTE — ED Provider Notes (Addendum)
History     CSN: [id number]  Arrival date & time 02/07/12  1426   First MD Initiated Contact with Patient 02/07/12 1536      Chief Complaint  Patient presents with  . Fall    (Consider location/radiation/quality/duration/timing/severity/associated sxs/prior treatment) Patient is a 46 y.o. female presenting with fall. The history is provided by the patient.  Fall Incident onset: 3 days ago. Incident: Stood up and started seeing spots. tHen woke up on the floor. She fell from a height of 1 to 2 ft. She landed on carpet. There was no blood loss. Point of impact: Unsure. The pain is present in the neck (Left side of neck and left upper back and arm). The pain is at a severity of 10/10. The pain is severe. She was ambulatory at the scene. Associated symptoms include loss of consciousness. Pertinent negatives include no visual change, no fever, no numbness, no abdominal pain, no nausea, no vomiting, no headaches and no tingling. The symptoms are aggravated by activity and rotation. She has tried acetaminophen and NSAIDs for the symptoms. The treatment provided mild relief.    Past Medical History  Diagnosis Date  . Hyperlipidemia   . Hypertension   . Hernia   . Anemia     Blood transfusions  . Irritable bowel disease   . Chronic headaches   . History of chronic ear infection   . Cervical disc disease   . Blood transfusion   . Neuromuscular disorder   . Nasal congestion   . Leg swelling   . Nausea   . Abdominal pain   . Migraines   . Constipation     Past Surgical History  Procedure Date  . Small intestine surgery   . Hernia repair 04/21/10    x2    Family History  Problem Relation Age of Onset  . Heart disease Mother     History  Substance Use Topics  . Smoking status: Current Every Day Smoker -- 1.0 packs/day  . Smokeless tobacco: Not on file  . Alcohol Use: No    OB History    Grav Para Term Preterm Abortions TAB SAB Ect Mult Living                   Review of Systems  Constitutional: Negative for fever.  Respiratory: Negative for cough and shortness of breath.   Gastrointestinal: Negative for nausea, vomiting and abdominal pain.  Neurological: Positive for loss of consciousness and syncope. Negative for tingling, speech difficulty, weakness, numbness and headaches.  All other systems reviewed and are negative.    Allergies  Morphine and related and Penicillins  Home Medications   Current Outpatient Rx  Name  Route  Sig  Dispense  Refill  . ALPRAZOLAM 1 MG PO TABS   Oral   Take 1 mg by mouth at bedtime as needed. For sleep         . ASPIRIN 81 MG PO TABS   Oral   Take 81 mg by mouth 2 (two) times daily.         . ATORVASTATIN CALCIUM 40 MG PO TABS   Oral   Take 40 mg by mouth at bedtime.          Marland Kitchen BIOTIN PO   Oral   Take 1 tablet by mouth daily.          . OMEGA-3 FATTY ACIDS 1000 MG PO CAPS   Oral   Take 1 g by mouth daily.           Marland Kitchen  BENGAY EX   Apply externally   Apply 1 application topically 2 (two) times daily as needed. For shoulder and back pain         . ONE-DAILY MULTI VITAMINS PO TABS   Oral   Take 1 tablet by mouth daily.           . ICAPS MV PO   Oral   Take 1 tablet by mouth daily.         Marland Kitchen OMEPRAZOLE 20 MG PO CPDR   Oral   Take 20 mg by mouth daily.          . SERTRALINE HCL 50 MG PO TABS   Oral   Take 100 mg by mouth daily.          Marland Kitchen ZOLPIDEM TARTRATE 5 MG PO TABS   Oral   Take 5 mg by mouth at bedtime as needed. For sleep           BP 147/92  Pulse 84  Temp 98.1 F (36.7 C) (Oral)  Resp 18  SpO2 99%  Physical Exam  Nursing note and vitals reviewed. Constitutional: She is oriented to person, place, and time. She appears well-developed and well-nourished. No distress.  HENT:  Head: Normocephalic and atraumatic.  Mouth/Throat: Oropharynx is clear and moist.  Eyes: Conjunctivae normal and EOM are normal. Pupils are equal, round, and reactive to  light.  Neck: Normal range of motion. Neck supple. Spinous process tenderness and muscular tenderness present.    Cardiovascular: Normal rate, regular rhythm and intact distal pulses.   No murmur heard. Pulmonary/Chest: Effort normal and breath sounds normal. No respiratory distress. She has no wheezes. She has no rales.  Abdominal: Soft. She exhibits no distension. There is no tenderness. There is no rebound and no guarding.       Multiple well-healed surgical scars  Musculoskeletal: She exhibits tenderness. She exhibits no edema.       Cervical back: She exhibits decreased range of motion, tenderness and spasm.       Thoracic back: Normal.       Lumbar back: Normal.       Back:  Neurological: She is alert and oriented to person, place, and time.  Skin: Skin is warm and dry. No rash noted. No erythema.  Psychiatric: She has a normal mood and affect. Her behavior is normal.    ED Course  Procedures (including critical care time)  Labs Reviewed  COMPREHENSIVE METABOLIC PANEL - Abnormal; Notable for the following:    Potassium 3.4 (*)     Glucose, Bld 113 (*)     All other components within normal limits  CBC WITH DIFFERENTIAL  POCT I-STAT TROPONIN I   Ct Cervical Spine Wo Contrast  02/07/2012  *RADIOLOGY REPORT*  Clinical Data: Larey Seat 3 days ago.  Left-sided neck pain, radiates into the left shoulder blade and into the left arm.  Tingling in the left upper extremity.  Pain is worsening.  Pain worse with movement.  CT CERVICAL SPINE WITHOUT CONTRAST  Technique:  Multidetector CT imaging of the cervical spine was performed. Multiplanar CT image reconstructions were also generated.  Comparison: None  Findings: There is reversal of cervical lordosis.  Degenerative changes are identified, most notably at C5-6. At this level, there is left foraminal narrowing.  There is no evidence for acute fracture or subluxation. Prevertebral soft tissues have a normal appearance.  The lung apices are  clear. The visualized portion of the thyroid gland has a  normal appearance.  IMPRESSION:  1.  Mild degenerate changes. 2.  Reversal of cervical lordosis. 3. No evidence for acute  abnormality.   Original Report Authenticated By: Norva Pavlov, M.D.      Date: 02/07/2012  Rate: 83  Rhythm: normal sinus rhythm  QRS Axis: normal  Intervals: normal  ST/T Wave abnormalities: normal  Conduction Disutrbances: none  Narrative Interpretation: unremarkable      1. Trapezius muscle spasm   2. Syncope       MDM   Patient with a syncopal episode 3 days ago after standing up too quickly. Since the fall she has had left paracervical and trapezius pain. She is complaining of pain down the arm but no true weakness, numbness or tingling. Patient has had no other syncopal episodes and denies headache. She has a normal neurologic exam. EKG is within normal limits and she has no prior history of cardiac pathology.   no suspicion for cardiac cause of syncope and feel most likely orthostatic hypotension. Patient has felt fine since and do not feel he needs further workup given the normal EKG. Secondly patient's complaining of more muscular pain however she does have some mild C-spine tenderness. We'll do a CT of the C-spine to ensure no acute fractures. Patient was given pain control.  4:54 PM CT neg.  Will d/c home with supportive meds for pain.      Gwyneth Sprout, MD 02/07/12 1654  Gwyneth Sprout, MD 02/07/12 1715

## 2012-02-07 NOTE — ED Notes (Addendum)
Pt presents with report of fall x 3 days ago.  Pt reports she had a migraine that morning became dizzy, falling onto L side.  Unsure of LOC, pt states "I remember getting up".  Pt reports pain to L shoulder and neck.  Pt reports she has not had a migraine x 1 year and has not refilled maxalt or imitrex.

## 2012-02-12 ENCOUNTER — Emergency Department (HOSPITAL_COMMUNITY)
Admission: EM | Admit: 2012-02-12 | Discharge: 2012-02-12 | Disposition: A | Discharge status: Home / Self Care | Payer: Medicare Other | Attending: Emergency Medicine | Admitting: Emergency Medicine

## 2012-02-12 ENCOUNTER — Emergency Department (HOSPITAL_COMMUNITY): Payer: Medicare Other

## 2012-02-12 ENCOUNTER — Encounter (HOSPITAL_COMMUNITY): Payer: Self-pay | Admitting: *Deleted

## 2012-02-12 DIAGNOSIS — Z79899 Other long term (current) drug therapy: Secondary | ICD-10-CM | POA: Insufficient documentation

## 2012-02-12 DIAGNOSIS — I10 Essential (primary) hypertension: Secondary | ICD-10-CM | POA: Insufficient documentation

## 2012-02-12 DIAGNOSIS — R209 Unspecified disturbances of skin sensation: Secondary | ICD-10-CM | POA: Insufficient documentation

## 2012-02-12 DIAGNOSIS — Z8679 Personal history of other diseases of the circulatory system: Secondary | ICD-10-CM | POA: Insufficient documentation

## 2012-02-12 DIAGNOSIS — Y939 Activity, unspecified: Secondary | ICD-10-CM | POA: Insufficient documentation

## 2012-02-12 DIAGNOSIS — Z862 Personal history of diseases of the blood and blood-forming organs and certain disorders involving the immune mechanism: Secondary | ICD-10-CM | POA: Insufficient documentation

## 2012-02-12 DIAGNOSIS — Z8739 Personal history of other diseases of the musculoskeletal system and connective tissue: Secondary | ICD-10-CM | POA: Insufficient documentation

## 2012-02-12 DIAGNOSIS — M79603 Pain in arm, unspecified: Secondary | ICD-10-CM

## 2012-02-12 DIAGNOSIS — G8911 Acute pain due to trauma: Secondary | ICD-10-CM | POA: Insufficient documentation

## 2012-02-12 DIAGNOSIS — F17200 Nicotine dependence, unspecified, uncomplicated: Secondary | ICD-10-CM | POA: Insufficient documentation

## 2012-02-12 DIAGNOSIS — M79609 Pain in unspecified limb: Secondary | ICD-10-CM | POA: Insufficient documentation

## 2012-02-12 DIAGNOSIS — J189 Pneumonia, unspecified organism: Secondary | ICD-10-CM | POA: Insufficient documentation

## 2012-02-12 DIAGNOSIS — Y929 Unspecified place or not applicable: Secondary | ICD-10-CM | POA: Insufficient documentation

## 2012-02-12 DIAGNOSIS — M25519 Pain in unspecified shoulder: Secondary | ICD-10-CM | POA: Insufficient documentation

## 2012-02-12 DIAGNOSIS — E785 Hyperlipidemia, unspecified: Secondary | ICD-10-CM | POA: Insufficient documentation

## 2012-02-12 DIAGNOSIS — Z8719 Personal history of other diseases of the digestive system: Secondary | ICD-10-CM | POA: Insufficient documentation

## 2012-02-12 DIAGNOSIS — Z8669 Personal history of other diseases of the nervous system and sense organs: Secondary | ICD-10-CM | POA: Insufficient documentation

## 2012-02-12 DIAGNOSIS — Z8709 Personal history of other diseases of the respiratory system: Secondary | ICD-10-CM | POA: Insufficient documentation

## 2012-02-12 DIAGNOSIS — Z7982 Long term (current) use of aspirin: Secondary | ICD-10-CM | POA: Insufficient documentation

## 2012-02-12 DIAGNOSIS — W19XXXA Unspecified fall, initial encounter: Secondary | ICD-10-CM | POA: Insufficient documentation

## 2012-02-12 LAB — TROPONIN I: Troponin I: <0.3 ng/mL (ref <0.30)

## 2012-02-12 LAB — COMPREHENSIVE METABOLIC PANEL
ALT: 17 U/L (ref 0–35)
AST: 19 U/L (ref 0–37)
Albumin: 4 g/dL (ref 3.5–5.2)
Alkaline Phosphatase: 73 U/L (ref 39–117)
BUN: 10 mg/dL (ref 6–23)
CO2: 26 mEq/L (ref 19–32)
Calcium: 9.6 mg/dL (ref 8.4–10.5)
Chloride: 100 mEq/L (ref 96–112)
Creatinine, Ser: 0.66 mg/dL (ref 0.50–1.10)
GFR calc Af Amer: >90 mL/min (ref >90)
GFR calc non Af Amer: >90 mL/min (ref >90)
Glucose, Bld: 134 mg/dL — ABNORMAL HIGH (ref 70–99)
Potassium: 3.3 mEq/L — ABNORMAL LOW (ref 3.5–5.1)
Sodium: 139 mEq/L (ref 135–145)
Total Bilirubin: 0.2 mg/dL — ABNORMAL LOW (ref 0.3–1.2)
Total Protein: 7.8 g/dL (ref 6.0–8.3)

## 2012-02-12 LAB — CBC WITH DIFFERENTIAL/PLATELET
Basophils Absolute: 0.1 10*3/uL (ref 0.0–0.1)
Basophils Relative: 1 % (ref 0–1)
Eosinophils Absolute: 0.2 10*3/uL (ref 0.0–0.7)
Eosinophils Relative: 2 % (ref 0–5)
HCT: 43.1 % (ref 36.0–46.0)
Hemoglobin: 14.8 g/dL (ref 12.0–15.0)
Lymphocytes Relative: 27 % (ref 12–46)
Lymphs Abs: 2.5 10*3/uL (ref 0.7–4.0)
MCH: 29.7 pg (ref 26.0–34.0)
MCHC: 34.3 g/dL (ref 30.0–36.0)
MCV: 86.5 fL (ref 78.0–100.0)
Monocytes Absolute: 0.6 10*3/uL (ref 0.1–1.0)
Monocytes Relative: 6 % (ref 3–12)
Neutro Abs: 6 10*3/uL (ref 1.7–7.7)
Neutrophils Relative %: 64 % (ref 43–77)
Platelets: 299 10*3/uL (ref 150–400)
RBC: 4.98 MIL/uL (ref 3.87–5.11)
RDW: 12.9 % (ref 11.5–15.5)
WBC: 9.4 10*3/uL (ref 4.0–10.5)

## 2012-02-12 MED ORDER — OXYCODONE-ACETAMINOPHEN 5-325 MG PO TABS
2.0000 | ORAL_TABLET | Freq: Once | ORAL | Status: AC
  Administered 2012-02-12: 2 via ORAL
  Filled 2012-02-12: qty 2

## 2012-02-12 MED ORDER — OXYCODONE-ACETAMINOPHEN 5-325 MG PO TABS: 1.0000 | ORAL_TABLET | Freq: Four times a day (QID) | ORAL | Status: DC | PRN

## 2012-02-12 MED ORDER — AZITHROMYCIN 250 MG PO TABS
500.0000 mg | ORAL_TABLET | Freq: Once | ORAL | Status: AC
  Administered 2012-02-12: 500 mg via ORAL
  Filled 2012-02-12: qty 2

## 2012-02-12 MED ORDER — CYCLOBENZAPRINE HCL 10 MG PO TABS
10.0000 mg | ORAL_TABLET | Freq: Once | ORAL | Status: AC
  Administered 2012-02-12: 10 mg via ORAL
  Filled 2012-02-12: qty 1

## 2012-02-12 MED ORDER — AZITHROMYCIN 250 MG PO TABS: ORAL_TABLET | ORAL | Status: DC

## 2012-02-12 MED ORDER — HYDROMORPHONE HCL PF 1 MG/ML IJ SOLN
1.0000 mg | Freq: Once | INTRAMUSCULAR | Status: AC
  Administered 2012-02-12: 1 mg via INTRAMUSCULAR
  Filled 2012-02-12: qty 1

## 2012-02-12 NOTE — ED Notes (Signed)
Pt made aware that she will need a ride home. States family member en route to ED.

## 2012-02-12 NOTE — ED Notes (Signed)
PA made aware of 9/10. PA at bedside

## 2012-02-12 NOTE — ED Provider Notes (Signed)
History     CSN: [id number]  Arrival date & time 02/12/12  1728   First MD Initiated Contact with Patient 02/12/12 1942      Chief Complaint  Patient presents with  . Shoulder Pain    (Consider location/radiation/quality/duration/timing/severity/associated sxs/prior treatment) HPI Comments: 46 year old female presents to the emergency department complaining of returning left shoulder pain after being seen on December 5. Patient had a fall prior to her visit on the fifth and developed left-sided neck and shoulder pain. She had a CT scan at that time which did not show any acute abnormality of her neck. She was given muscle relaxers and pain medication which originally provided relief when she doubled up with the pain medication, however she ran out of the pain medicine and has not been using the muscle relaxers. The pain as constant, worse with movement and palpation rated 9-1/2 out of 10. States that the tips of all of her fingers are tingly. Now states the left side of her chest is beginning to hurt with associated shortness of breath. Denies nausea, vomiting or diaphoresis.   The history is provided by the patient.    Past Medical History  Diagnosis Date  . Hyperlipidemia   . Hypertension   . Hernia   . Anemia     Blood transfusions  . Irritable bowel disease   . Chronic headaches   . History of chronic ear infection   . Cervical disc disease   . Blood transfusion   . Neuromuscular disorder   . Nasal congestion   . Leg swelling   . Nausea   . Abdominal pain   . Migraines   . Constipation     Past Surgical History  Procedure Date  . Small intestine surgery   . Hernia repair 04/21/10    x2    Family History  Problem Relation Age of Onset  . Heart disease Mother     History  Substance Use Topics  . Smoking status: Current Every Day Smoker -- 1.0 packs/day  . Smokeless tobacco: Not on file  . Alcohol Use: No    OB History    Grav Para Term Preterm Abortions  TAB SAB Ect Mult Living                  Review of Systems  Constitutional: Negative for diaphoresis.  HENT: Positive for neck pain. Negative for neck stiffness.   Eyes: Negative for visual disturbance.  Respiratory: Positive for shortness of breath.   Cardiovascular: Positive for chest pain.  Gastrointestinal: Negative for nausea and vomiting.  Genitourinary: Negative.   Musculoskeletal:       Positive for left shoulder pain.  Skin: Negative.   Neurological: Negative for dizziness and light-headedness.  Psychiatric/Behavioral: Negative for confusion.    Allergies  Morphine and related and Penicillins  Home Medications   Current Outpatient Rx  Name  Route  Sig  Dispense  Refill  . ALPRAZOLAM 1 MG PO TABS   Oral   Take 1 mg by mouth at bedtime as needed. For sleep         . ASPIRIN 81 MG PO TABS   Oral   Take 81 mg by mouth 2 (two) times daily.         . ATORVASTATIN CALCIUM 40 MG PO TABS   Oral   Take 40 mg by mouth at bedtime.          Marland Kitchen BIOTIN PO   Oral   Take 1  tablet by mouth daily.          . CYCLOBENZAPRINE HCL 10 MG PO TABS   Oral   Take 10 mg by mouth 2 (two) times daily as needed. For muscle spasms         . OMEGA-3 FATTY ACIDS 1000 MG PO CAPS   Oral   Take 1 g by mouth daily.           Valetta Fuller HOT EX   Apply externally   Apply topically daily as needed.         Marland Kitchen ONE-DAILY MULTI VITAMINS PO TABS   Oral   Take 1 tablet by mouth daily.           . ICAPS PO   Oral   Take 1 capsule by mouth daily.         Marland Kitchen OMEPRAZOLE 20 MG PO CPDR   Oral   Take 20 mg by mouth daily.          . OXYCODONE-ACETAMINOPHEN 7.5-325 MG PO TABS   Oral   Take 1 tablet by mouth every 4 (four) hours as needed. For pain         . SERTRALINE HCL 50 MG PO TABS   Oral   Take 100 mg by mouth daily.            BP 131/90  Pulse 82  Temp 98 F (36.7 C) (Oral)  Resp 24  SpO2 97%  Physical Exam  Nursing note and vitals  reviewed. Constitutional: She is oriented to person, place, and time. She appears well-developed and well-nourished. No distress.       Groaning  HENT:  Head: Normocephalic and atraumatic.  Mouth/Throat: Oropharynx is clear and moist.  Eyes: Conjunctivae normal and EOM are normal. Pupils are equal, round, and reactive to light.  Neck: Normal range of motion. Neck supple.  Cardiovascular: Normal rate, regular rhythm, normal heart sounds and intact distal pulses.   Pulmonary/Chest: Breath sounds normal. Tachypnea noted. She has no decreased breath sounds. She exhibits no tenderness.  Abdominal: Soft. Bowel sounds are normal. There is no tenderness.  Musculoskeletal:       Left shoulder: She exhibits tenderness. She exhibits normal range of motion and normal pulse.       Cervical back: She exhibits tenderness. She exhibits no bony tenderness.       Back:       Left upper arm: She exhibits tenderness.       Arms: Neurological: She is alert and oriented to person, place, and time. She has normal strength. No sensory deficit.  Skin: Skin is warm and dry.  Psychiatric: She has a normal mood and affect. Her speech is normal. She is is hyperactive.    ED Course  Procedures (including critical care time)  Date: 02/12/2012  Rate: 92  Rhythm: normal sinus rhythm  QRS Axis: normal  Intervals: normal  ST/T Wave abnormalities: normal  Conduction Disutrbances:none  Narrative Interpretation: normal EKG  Old EKG Reviewed: unchanged   Labs Reviewed  COMPREHENSIVE METABOLIC PANEL - Abnormal; Notable for the following:    Potassium 3.3 (*)     Glucose, Bld 134 (*)     Total Bilirubin 0.2 (*)     All other components within normal limits  CBC WITH DIFFERENTIAL  TROPONIN I   Dg Chest 2 View  02/12/2012  *RADIOLOGY REPORT*  Clinical Data: Left chest/shoulder pain, fall  CHEST - 2 VIEW  Comparison: None.  Findings:  Increased interstitial markings.  Mild patchy left lower lobe opacity, possibly  infectious.  No pleural effusion or pneumothorax.  Cardiomediastinal silhouette is within normal limits.  Visualized osseous structures are within normal limits.  IMPRESSION: Mild patchy left lower lobe opacity, possibly infectious.   Original Report Authenticated By: Charline Bills, M.D.      1. Shoulder pain   2. Arm pain   3. Community acquired pneumonia       MDM  46 y/o female with returning left shoulder pain after fall and being seen on Dec 5. She ran out of pain medication and has not been using muscle relaxer. Diffuse tenderness to palpation of left side of neck and entire left arm. When listening to patient's lungs, I palpated neck and arm at same time while she was distracted. She did not wince or move in any way making me believe she is uncomfortable. I will manage her pain in the ED. 9:48 PM Opacity seen on CXR concerning for infectious etiology. Will treat with zithromax for pneumonia. Dilaudid IM given for pain since percocet did not help much. Patient is moving her arm without any difficulty. Sleeping comfortably until someone comes into the room to talk to her. Sling given for comfort. Advised f/u with ortho if no improvement. 10 percocet given until she can see ortho.        Trevor Mace, PA-C 02/12/12 2150

## 2012-02-12 NOTE — Progress Notes (Signed)
Orthopedic Tech Progress Note Patient Details:  [name] 1965/03/20 [id number]  Ortho Devices Type of Ortho Device: Arm sling Ortho Device/Splint Location: (L) UE Ortho Device/Splint Interventions: Application   Jennye Moccasin 02/12/2012, 9:52 PM

## 2012-02-12 NOTE — ED Notes (Signed)
The pt is c/o lt shoulder pain and pain in her lt neck for 2 weeks.  The pt is  Anxious and she has had this pain in the past and it was not cardiac .  Hyperventilating on arrival.  She has been taking percocet and muscle relaxers that are not helping very much

## 2012-02-12 NOTE — Discharge Instructions (Signed)
It is important to take the entire course of antibiotics. Your given her first dose in the emergency department. No driving or operating heavy machinery while taking Percocet as it may make you drowsy. Wear the sling as discussed. Followup with orthopedics as advised. Pneumonia, Adult Pneumonia is an infection of the lungs.  CAUSES Pneumonia may be caused by bacteria or a virus. Usually, these infections are caused by breathing infectious particles into the lungs (respiratory tract). SYMPTOMS   Cough.  Fever.  Chest pain.  Increased rate of breathing.  Wheezing.  Mucus production. DIAGNOSIS  If you have the common symptoms of pneumonia, your caregiver will typically confirm the diagnosis with a chest X-ray. The X-ray will show an abnormality in the lung (pulmonary infiltrate) if you have pneumonia. Other tests of your blood, urine, or sputum may be done to find the specific cause of your pneumonia. Your caregiver may also do tests (blood gases or pulse oximetry) to see how well your lungs are working. TREATMENT  Some forms of pneumonia may be spread to other people when you cough or sneeze. You may be asked to wear a mask before and during your exam. Pneumonia that is caused by bacteria is treated with antibiotic medicine. Pneumonia that is caused by the influenza virus may be treated with an antiviral medicine. Most other viral infections must run their course. These infections will not respond to antibiotics.  PREVENTION A pneumococcal shot (vaccine) is available to prevent a common bacterial cause of pneumonia. This is usually suggested for:  People over 62 years old.  Patients on chemotherapy.  People with chronic lung problems, such as bronchitis or emphysema.  People with immune system problems. If you are over 65 or have a high risk condition, you may receive the pneumococcal vaccine if you have not received it before. In some countries, a routine influenza vaccine is also  recommended. This vaccine can help prevent some cases of pneumonia.You may be offered the influenza vaccine as part of your care. If you smoke, it is time to quit. You may receive instructions on how to stop smoking. Your caregiver can provide medicines and counseling to help you quit. HOME CARE INSTRUCTIONS   Cough suppressants may be used if you are losing too much rest. However, coughing protects you by clearing your lungs. You should avoid using cough suppressants if you can.  Your caregiver may have prescribed medicine if he or she thinks your pneumonia is caused by a bacteria or influenza. Finish your medicine even if you start to feel better.  Your caregiver may also prescribe an expectorant. This loosens the mucus to be coughed up.  Only take over-the-counter or prescription medicines for pain, discomfort, or fever as directed by your caregiver.  Do not smoke. Smoking is a common cause of bronchitis and can contribute to pneumonia. If you are a smoker and continue to smoke, your cough may last several weeks after your pneumonia has cleared.  A cold steam vaporizer or humidifier in your room or home may help loosen mucus.  Coughing is often worse at night. Sleeping in a semi-upright position in a recliner or using a couple pillows under your head will help with this.  Get rest as you feel it is needed. Your body will usually let you know when you need to rest. SEEK IMMEDIATE MEDICAL CARE IF:   Your illness becomes worse. This is especially true if you are elderly or weakened from any other disease.  You cannot control  your cough with suppressants and are losing sleep.  You begin coughing up blood.  You develop pain which is getting worse or is uncontrolled with medicines.  You have a fever.  Any of the symptoms which initially brought you in for treatment are getting worse rather than better.  You develop shortness of breath or chest pain. MAKE SURE YOU:   Understand these  instructions.  Will watch your condition.  Will get help right away if you are not doing well or get worse. Document Released: 02/19/2005 Document Revised: 05/14/2011 Document Reviewed: 05/11/2010 [facility] Patient Information 2013 [place], [place]. Shoulder Pain The shoulder is the joint that connects your arms to your body. The bones that form the shoulder joint include the upper arm bone (humerus), the shoulder blade (scapula), and the collarbone (clavicle). The top of the humerus is shaped like a ball and fits into a rather flat socket on the scapula (glenoid cavity). A combination of muscles and strong, fibrous tissues that connect muscles to bones (tendons) support your shoulder joint and hold the ball in the socket. Small, fluid-filled sacs (bursae) are located in different areas of the joint. They act as cushions between the bones and the overlying soft tissues and help reduce friction between the gliding tendons and the bone as you move your arm. Your shoulder joint allows a wide range of motion in your arm. This range of motion allows you to do things like scratch your back or throw a ball. However, this range of motion also makes your shoulder more prone to pain from overuse and injury. Causes of shoulder pain can originate from both injury and overuse and usually can be grouped in the following four categories:  Redness, swelling, and pain (inflammation) of the tendon (tendinitis) or the bursae (bursitis).  Instability, such as a dislocation of the joint.  Inflammation of the joint (arthritis).  Broken bone (fracture). HOME CARE INSTRUCTIONS   Apply ice to the sore area.  Put ice in a plastic bag.  Place a towel between your skin and the bag.  Leave the ice on for 15 to 20 minutes, 3 to 4 times per day for the first 2 days.  If you have a shoulder sling or immobilizer, wear it as long as your caregiver instructs. Only remove it to shower or bathe. Move your arm as little as  possible, but keep your hand moving to prevent swelling.  Only take over-the-counter or prescription medicines for pain, discomfort, or fever as directed by your caregiver. SEEK MEDICAL CARE IF:   Your shoulder pain increases, or new pain develops in your arm, hand, or fingers.  Your hand or fingers become cold and numb.  Your pain is not relieved with medicines. SEEK IMMEDIATE MEDICAL CARE IF:   Your arm, hand, or fingers are numb or tingling.  Your arm, hand, or fingers are significantly swollen or turn white or blue. MAKE SURE YOU:   Understand these instructions.  Will watch your condition.  Will get help right away if you are not doing well or get worse. Document Released: 11/29/2004 Document Revised: 05/14/2011 Document Reviewed: 02/03/2011 [facility] Patient Information 2013 [place], [place].

## 2012-02-13 NOTE — ED Provider Notes (Signed)
Medical screening examination/treatment/procedure(s) were performed by non-physician practitioner and as supervising physician I was immediately available for consultation/collaboration.  Sonyia Muro L Justiss Gerbino, MD 02/13/12 0105 

## 2012-02-15 ENCOUNTER — Other Ambulatory Visit: Payer: Self-pay | Admitting: Orthopedic Surgery

## 2012-02-15 DIAGNOSIS — M542 Cervicalgia: Secondary | ICD-10-CM

## 2012-02-21 ENCOUNTER — Other Ambulatory Visit: Payer: Medicare Other

## 2012-05-21 ENCOUNTER — Other Ambulatory Visit: Payer: Self-pay | Admitting: Gastroenterology

## 2012-05-21 DIAGNOSIS — R109 Unspecified abdominal pain: Secondary | ICD-10-CM

## 2012-05-26 ENCOUNTER — Other Ambulatory Visit: Payer: Medicare Other

## 2012-05-29 ENCOUNTER — Other Ambulatory Visit: Payer: Medicare Other

## 2012-06-13 ENCOUNTER — Ambulatory Visit
Admission: RE | Admit: 2012-06-13 | Discharge: 2012-06-13 | Disposition: A | Discharge status: Home / Self Care | Payer: Medicare Other | Source: Ambulatory Visit | Attending: Gastroenterology | Admitting: Gastroenterology

## 2012-06-13 DIAGNOSIS — R109 Unspecified abdominal pain: Secondary | ICD-10-CM

## 2012-06-13 MED ORDER — IOHEXOL 300 MG/ML  SOLN
125.0000 mL | Freq: Once | INTRAMUSCULAR | Status: AC | PRN
  Administered 2012-06-13: 125 mL via INTRAVENOUS

## 2013-09-24 ENCOUNTER — Ambulatory Visit (INDEPENDENT_AMBULATORY_CARE_PROVIDER_SITE_OTHER): Payer: Medicare Other | Admitting: Obstetrics & Gynecology

## 2013-09-24 ENCOUNTER — Encounter: Payer: Self-pay | Admitting: Obstetrics & Gynecology

## 2013-09-24 ENCOUNTER — Ambulatory Visit: Payer: Self-pay | Admitting: Obstetrics & Gynecology

## 2013-09-24 ENCOUNTER — Other Ambulatory Visit: Payer: Self-pay

## 2013-09-24 VITALS — BP 121/75 | HR 71 | Temp 97.5°F | Ht 72.0 in | Wt 251.0 lb

## 2013-09-24 DIAGNOSIS — Z01419 Encounter for gynecological examination (general) (routine) without abnormal findings: Secondary | ICD-10-CM

## 2013-09-24 NOTE — Progress Notes (Signed)
Subjective:     [name] is a 48 y.o. female here for a routine exam.  Current complaints: no.    Personal health questionnaire:  Is patient Ashkenazi Jewish, have a family history of breast and/or ovarian cancer: no Is there a family history of uterine cancer diagnosed at age < 73, gastrointestinal cancer, urinary tract cancer, family member who is a Field seismologist syndrome-associated carrier: no Is the patient overweight and hypertensive, family history of diabetes, personal history of gestational diabetes or PCOS: yes Is patient over 29, have PCOS,  family history of premature CHD under age 81, diabetes, smoke, have hypertension or peripheral artery disease:  yes  . Gynecologic History No LMP recorded. Patient is postmenopausal. Contraception: abstinence Last Pap results were: normal Last mammogram: 2 wks ago. Results were: normal  Obstetric History OB History  No data available    Past Medical History  Diagnosis Date  . Hyperlipidemia   . Hypertension   . Hernia   . Anemia     Blood transfusions  . Irritable bowel disease   . Chronic headaches   . History of chronic ear infection   . Cervical disc disease   . Blood transfusion   . Neuromuscular disorder   . Nasal congestion   . Leg swelling   . Nausea   . Abdominal pain   . Migraines   . Constipation     Past Surgical History  Procedure Laterality Date  . Small intestine surgery    . Hernia repair  04/21/10    x2    Current outpatient prescriptions:ALPRAZolam (XANAX) 1 MG tablet, Take 1 mg by mouth at bedtime as needed. For sleep, Disp: , Rfl: ;  aspirin 81 MG tablet, Take 81 mg by mouth 2 (two) times daily., Disp: , Rfl: ;  atorvastatin (LIPITOR) 40 MG tablet, Take 40 mg by mouth at bedtime. , Disp: , Rfl: ;  BIOTIN PO, Take 1 tablet by mouth daily. , Disp: , Rfl: ;  fish oil-omega-3 fatty acids 1000 MG capsule, Take 1 g by mouth daily.  , Disp: , Rfl:  Menthol, Topical Analgesic, (ICY HOT EX), Apply topically  daily as needed., Disp: , Rfl: ;  Multiple Vitamin (MULTIVITAMIN) tablet, Take 1 tablet by mouth daily.  , Disp: , Rfl: ;  Multiple Vitamins-Minerals (ICAPS PO), Take 1 capsule by mouth daily., Disp: , Rfl: ;  omeprazole (PRILOSEC) 20 MG capsule, Take 20 mg by mouth daily. , Disp: , Rfl: ;  sertraline (ZOLOFT) 50 MG tablet, Take 100 mg by mouth daily. , Disp: , Rfl:  Allergies  Allergen Reactions  . Morphine And Related     Pt gets nightmares and hives   . Penicillins Hives    Stomach cramps     History  Substance Use Topics  . Smoking status: Current Every Day Smoker -- 1.00 packs/day  . Smokeless tobacco: Not on file  . Alcohol Use: No    Family History  Problem Relation Age of Onset  . Heart disease Mother       Review of Systems  Constitutional: negative for fatigue and weight loss Respiratory: negative for cough and wheezing Cardiovascular: negative for chest pain, fatigue and palpitations Gastrointestinal: negative for abdominal pain and change in bowel habits Musculoskeletal:negative for myalgias Neurological: negative for gait problems and tremors Behavioral/Psych: negative for abusive relationship, depression Endocrine: negative for temperature intolerance   Genitourinary:negative for abnormal bleeding, genital lesions, hot flashes, sexual problems and vaginal discharge Integument/breast: negative for breast lump,  breast tenderness, nipple discharge and skin lesion(s)    Objective:       BP 121/75  Pulse 71  Temp(Src) 97.5 F (36.4 C)  Ht 6' (1.829 m)  Wt 113.853 kg (251 lb)  BMI 34.03 kg/m2 General:   alert  Skin:   no rash or abnormalities  Lungs:   clear to auscultation bilaterally  Heart:   regular rate and rhythm, S1, S2 normal, no murmur, click, rub or gallop  Breasts:   normal without suspicious masses, skin or nipple changes or axillary nodes  Abdomen:  normal findings: no organomegaly, soft, non-tender and no hernia  Pelvis:  External genitalia:  normal general appearance Urinary system: urethral meatus normal and bladder without fullness, nontender Vaginal: normal without tenderness, induration or masses Cervix: normal appearance Adnexa: normal bimanual exam Uterus: anteverted and non-tender, normal size   Lab Review  Labs reviewed no Radiologic studies reviewed no    Assessment:    Healthy female exam.   Postmenopausal--not a candidate for ERT Plan:    Education reviewed: calcium supplements, low fat, low cholesterol diet and weight bearing exercise.  Need to obtain previous records Follow up as needed.

## 2013-09-25 LAB — HIV ANTIBODY (ROUTINE TESTING W REFLEX): HIV 1&2 Ab, 4th Generation: NONREACTIVE

## 2013-09-25 LAB — PAP IG AND HPV HIGH-RISK: HPV DNA High Risk: NOT DETECTED

## 2013-09-25 LAB — RPR

## 2013-09-25 NOTE — Patient Instructions (Signed)

## 2014-03-01 ENCOUNTER — Encounter: Payer: Self-pay | Admitting: *Deleted

## 2014-03-02 ENCOUNTER — Encounter: Payer: Self-pay | Admitting: Obstetrics & Gynecology

## 2014-05-04 DIAGNOSIS — Z79899 Other long term (current) drug therapy: Secondary | ICD-10-CM | POA: Diagnosis not present

## 2014-05-04 DIAGNOSIS — G894 Chronic pain syndrome: Secondary | ICD-10-CM | POA: Diagnosis not present

## 2014-05-04 DIAGNOSIS — M545 Low back pain: Secondary | ICD-10-CM | POA: Diagnosis not present

## 2014-05-04 DIAGNOSIS — M47817 Spondylosis without myelopathy or radiculopathy, lumbosacral region: Secondary | ICD-10-CM | POA: Diagnosis not present

## 2014-05-04 DIAGNOSIS — Q762 Congenital spondylolisthesis: Secondary | ICD-10-CM | POA: Diagnosis not present

## 2014-05-04 DIAGNOSIS — M5136 Other intervertebral disc degeneration, lumbar region: Secondary | ICD-10-CM | POA: Diagnosis not present

## 2014-05-04 DIAGNOSIS — Z79891 Long term (current) use of opiate analgesic: Secondary | ICD-10-CM | POA: Diagnosis not present

## 2014-05-17 ENCOUNTER — Other Ambulatory Visit: Payer: Self-pay | Admitting: Orthopaedic Surgery

## 2014-05-17 DIAGNOSIS — Q762 Congenital spondylolisthesis: Secondary | ICD-10-CM

## 2014-05-25 DIAGNOSIS — M545 Low back pain: Secondary | ICD-10-CM | POA: Diagnosis not present

## 2014-05-25 DIAGNOSIS — M7552 Bursitis of left shoulder: Secondary | ICD-10-CM | POA: Diagnosis not present

## 2014-05-25 DIAGNOSIS — Q762 Congenital spondylolisthesis: Secondary | ICD-10-CM | POA: Diagnosis not present

## 2014-05-25 DIAGNOSIS — M47817 Spondylosis without myelopathy or radiculopathy, lumbosacral region: Secondary | ICD-10-CM | POA: Diagnosis not present

## 2014-06-11 ENCOUNTER — Ambulatory Visit
Admission: RE | Admit: 2014-06-11 | Discharge: 2014-06-11 | Disposition: A | Discharge status: Home / Self Care | Payer: Medicare Other | Source: Ambulatory Visit | Attending: Orthopaedic Surgery | Admitting: Orthopaedic Surgery

## 2014-06-11 DIAGNOSIS — M47816 Spondylosis without myelopathy or radiculopathy, lumbar region: Secondary | ICD-10-CM | POA: Diagnosis not present

## 2014-06-11 DIAGNOSIS — M9973 Connective tissue and disc stenosis of intervertebral foramina of lumbar region: Secondary | ICD-10-CM | POA: Diagnosis not present

## 2014-06-11 DIAGNOSIS — M4316 Spondylolisthesis, lumbar region: Secondary | ICD-10-CM | POA: Diagnosis not present

## 2014-06-11 DIAGNOSIS — Q762 Congenital spondylolisthesis: Secondary | ICD-10-CM

## 2014-06-15 DIAGNOSIS — M5136 Other intervertebral disc degeneration, lumbar region: Secondary | ICD-10-CM | POA: Diagnosis not present

## 2014-06-15 DIAGNOSIS — M7552 Bursitis of left shoulder: Secondary | ICD-10-CM | POA: Diagnosis not present

## 2014-06-15 DIAGNOSIS — M545 Low back pain: Secondary | ICD-10-CM | POA: Diagnosis not present

## 2014-06-15 DIAGNOSIS — M4726 Other spondylosis with radiculopathy, lumbar region: Secondary | ICD-10-CM | POA: Diagnosis not present

## 2014-07-22 DIAGNOSIS — M5136 Other intervertebral disc degeneration, lumbar region: Secondary | ICD-10-CM | POA: Diagnosis not present

## 2014-07-22 DIAGNOSIS — M75102 Unspecified rotator cuff tear or rupture of left shoulder, not specified as traumatic: Secondary | ICD-10-CM | POA: Diagnosis not present

## 2014-07-22 DIAGNOSIS — M545 Low back pain: Secondary | ICD-10-CM | POA: Diagnosis not present

## 2014-08-17 DIAGNOSIS — Z79891 Long term (current) use of opiate analgesic: Secondary | ICD-10-CM | POA: Diagnosis not present

## 2014-08-17 DIAGNOSIS — Z79899 Other long term (current) drug therapy: Secondary | ICD-10-CM | POA: Diagnosis not present

## 2014-08-17 DIAGNOSIS — G894 Chronic pain syndrome: Secondary | ICD-10-CM | POA: Diagnosis not present

## 2014-08-17 DIAGNOSIS — M5136 Other intervertebral disc degeneration, lumbar region: Secondary | ICD-10-CM | POA: Diagnosis not present

## 2014-09-15 DIAGNOSIS — M25512 Pain in left shoulder: Secondary | ICD-10-CM | POA: Diagnosis not present

## 2014-09-15 DIAGNOSIS — M545 Low back pain: Secondary | ICD-10-CM | POA: Diagnosis not present

## 2014-09-15 DIAGNOSIS — M542 Cervicalgia: Secondary | ICD-10-CM | POA: Diagnosis not present

## 2014-09-15 DIAGNOSIS — M75102 Unspecified rotator cuff tear or rupture of left shoulder, not specified as traumatic: Secondary | ICD-10-CM | POA: Diagnosis not present

## 2014-09-27 ENCOUNTER — Ambulatory Visit: Payer: Medicare Other | Admitting: Obstetrics & Gynecology

## 2014-10-06 DIAGNOSIS — M5136 Other intervertebral disc degeneration, lumbar region: Secondary | ICD-10-CM | POA: Diagnosis not present

## 2014-11-19 DIAGNOSIS — Z79891 Long term (current) use of opiate analgesic: Secondary | ICD-10-CM | POA: Diagnosis not present

## 2014-11-19 DIAGNOSIS — G894 Chronic pain syndrome: Secondary | ICD-10-CM | POA: Diagnosis not present

## 2014-11-19 DIAGNOSIS — Z79899 Other long term (current) drug therapy: Secondary | ICD-10-CM | POA: Diagnosis not present

## 2014-12-23 DIAGNOSIS — M5136 Other intervertebral disc degeneration, lumbar region: Secondary | ICD-10-CM | POA: Diagnosis not present

## 2014-12-23 DIAGNOSIS — M4722 Other spondylosis with radiculopathy, cervical region: Secondary | ICD-10-CM | POA: Diagnosis not present

## 2014-12-23 DIAGNOSIS — M545 Low back pain: Secondary | ICD-10-CM | POA: Diagnosis not present

## 2014-12-23 DIAGNOSIS — M75102 Unspecified rotator cuff tear or rupture of left shoulder, not specified as traumatic: Secondary | ICD-10-CM | POA: Diagnosis not present

## 2015-01-25 DIAGNOSIS — M5136 Other intervertebral disc degeneration, lumbar region: Secondary | ICD-10-CM | POA: Diagnosis not present

## 2015-02-08 DIAGNOSIS — M159 Polyosteoarthritis, unspecified: Secondary | ICD-10-CM | POA: Diagnosis not present

## 2015-02-08 DIAGNOSIS — F329 Major depressive disorder, single episode, unspecified: Secondary | ICD-10-CM | POA: Diagnosis not present

## 2015-02-08 DIAGNOSIS — Z6836 Body mass index (BMI) 36.0-36.9, adult: Secondary | ICD-10-CM | POA: Diagnosis not present

## 2015-02-08 DIAGNOSIS — I10 Essential (primary) hypertension: Secondary | ICD-10-CM | POA: Diagnosis not present

## 2015-03-17 DIAGNOSIS — M4726 Other spondylosis with radiculopathy, lumbar region: Secondary | ICD-10-CM | POA: Diagnosis not present

## 2015-03-17 DIAGNOSIS — Q762 Congenital spondylolisthesis: Secondary | ICD-10-CM | POA: Diagnosis not present

## 2015-03-17 DIAGNOSIS — G894 Chronic pain syndrome: Secondary | ICD-10-CM | POA: Diagnosis not present

## 2015-03-17 DIAGNOSIS — Z79899 Other long term (current) drug therapy: Secondary | ICD-10-CM | POA: Diagnosis not present

## 2015-03-17 DIAGNOSIS — M545 Low back pain: Secondary | ICD-10-CM | POA: Diagnosis not present

## 2015-03-17 DIAGNOSIS — Z79891 Long term (current) use of opiate analgesic: Secondary | ICD-10-CM | POA: Diagnosis not present

## 2015-03-17 DIAGNOSIS — M25529 Pain in unspecified elbow: Secondary | ICD-10-CM | POA: Diagnosis not present

## 2015-04-15 DIAGNOSIS — M791 Myalgia: Secondary | ICD-10-CM | POA: Diagnosis not present

## 2015-04-15 DIAGNOSIS — I10 Essential (primary) hypertension: Secondary | ICD-10-CM | POA: Diagnosis not present

## 2015-04-15 DIAGNOSIS — G894 Chronic pain syndrome: Secondary | ICD-10-CM | POA: Diagnosis not present

## 2015-04-15 DIAGNOSIS — M5106 Intervertebral disc disorders with myelopathy, lumbar region: Secondary | ICD-10-CM | POA: Diagnosis not present

## 2015-05-11 DIAGNOSIS — M5136 Other intervertebral disc degeneration, lumbar region: Secondary | ICD-10-CM | POA: Diagnosis not present

## 2015-05-11 DIAGNOSIS — M5106 Intervertebral disc disorders with myelopathy, lumbar region: Secondary | ICD-10-CM | POA: Diagnosis not present

## 2015-05-11 DIAGNOSIS — G894 Chronic pain syndrome: Secondary | ICD-10-CM | POA: Diagnosis not present

## 2015-05-12 DIAGNOSIS — M50221 Other cervical disc displacement at C4-C5 level: Secondary | ICD-10-CM | POA: Diagnosis not present

## 2015-05-12 DIAGNOSIS — M47812 Spondylosis without myelopathy or radiculopathy, cervical region: Secondary | ICD-10-CM | POA: Diagnosis not present

## 2015-05-12 DIAGNOSIS — M50222 Other cervical disc displacement at C5-C6 level: Secondary | ICD-10-CM | POA: Diagnosis not present

## 2015-05-12 DIAGNOSIS — G894 Chronic pain syndrome: Secondary | ICD-10-CM | POA: Diagnosis not present

## 2015-05-12 DIAGNOSIS — M50223 Other cervical disc displacement at C6-C7 level: Secondary | ICD-10-CM | POA: Diagnosis not present

## 2015-06-06 DIAGNOSIS — I10 Essential (primary) hypertension: Secondary | ICD-10-CM | POA: Diagnosis not present

## 2015-06-06 DIAGNOSIS — R1012 Left upper quadrant pain: Secondary | ICD-10-CM | POA: Diagnosis not present

## 2015-06-06 DIAGNOSIS — M545 Low back pain: Secondary | ICD-10-CM | POA: Diagnosis not present

## 2015-06-07 ENCOUNTER — Other Ambulatory Visit: Payer: Self-pay | Admitting: Family Medicine

## 2015-06-07 DIAGNOSIS — Z1231 Encounter for screening mammogram for malignant neoplasm of breast: Secondary | ICD-10-CM

## 2015-06-08 DIAGNOSIS — M791 Myalgia: Secondary | ICD-10-CM | POA: Diagnosis not present

## 2015-06-08 DIAGNOSIS — M4722 Other spondylosis with radiculopathy, cervical region: Secondary | ICD-10-CM | POA: Diagnosis not present

## 2015-06-08 DIAGNOSIS — M545 Low back pain: Secondary | ICD-10-CM | POA: Diagnosis not present

## 2015-06-08 DIAGNOSIS — M7551 Bursitis of right shoulder: Secondary | ICD-10-CM | POA: Diagnosis not present

## 2015-06-14 ENCOUNTER — Ambulatory Visit: Payer: Medicare Other

## 2015-07-05 DIAGNOSIS — M5136 Other intervertebral disc degeneration, lumbar region: Secondary | ICD-10-CM | POA: Diagnosis not present

## 2015-07-05 DIAGNOSIS — G894 Chronic pain syndrome: Secondary | ICD-10-CM | POA: Diagnosis not present

## 2015-07-05 DIAGNOSIS — Z79891 Long term (current) use of opiate analgesic: Secondary | ICD-10-CM | POA: Diagnosis not present

## 2015-07-05 DIAGNOSIS — Z79899 Other long term (current) drug therapy: Secondary | ICD-10-CM | POA: Diagnosis not present

## 2015-07-05 DIAGNOSIS — M4722 Other spondylosis with radiculopathy, cervical region: Secondary | ICD-10-CM | POA: Diagnosis not present

## 2015-07-21 DIAGNOSIS — M5136 Other intervertebral disc degeneration, lumbar region: Secondary | ICD-10-CM | POA: Diagnosis not present

## 2015-07-25 ENCOUNTER — Ambulatory Visit: Payer: Medicare Other

## 2015-08-08 DIAGNOSIS — I10 Essential (primary) hypertension: Secondary | ICD-10-CM | POA: Diagnosis not present

## 2015-08-08 DIAGNOSIS — M1711 Unilateral primary osteoarthritis, right knee: Secondary | ICD-10-CM | POA: Diagnosis not present

## 2015-08-08 DIAGNOSIS — E785 Hyperlipidemia, unspecified: Secondary | ICD-10-CM | POA: Diagnosis not present

## 2015-08-12 DIAGNOSIS — M5136 Other intervertebral disc degeneration, lumbar region: Secondary | ICD-10-CM | POA: Diagnosis not present

## 2015-08-12 DIAGNOSIS — M4722 Other spondylosis with radiculopathy, cervical region: Secondary | ICD-10-CM | POA: Diagnosis not present

## 2015-09-05 DIAGNOSIS — G894 Chronic pain syndrome: Secondary | ICD-10-CM | POA: Diagnosis not present

## 2015-09-07 DIAGNOSIS — G894 Chronic pain syndrome: Secondary | ICD-10-CM | POA: Diagnosis not present

## 2015-09-07 DIAGNOSIS — M542 Cervicalgia: Secondary | ICD-10-CM | POA: Diagnosis not present

## 2015-09-07 DIAGNOSIS — Z79899 Other long term (current) drug therapy: Secondary | ICD-10-CM | POA: Diagnosis not present

## 2015-09-07 DIAGNOSIS — Z79891 Long term (current) use of opiate analgesic: Secondary | ICD-10-CM | POA: Diagnosis not present

## 2015-10-05 DIAGNOSIS — M791 Myalgia: Secondary | ICD-10-CM | POA: Diagnosis not present

## 2015-10-05 DIAGNOSIS — G894 Chronic pain syndrome: Secondary | ICD-10-CM | POA: Diagnosis not present

## 2015-11-09 DIAGNOSIS — E785 Hyperlipidemia, unspecified: Secondary | ICD-10-CM | POA: Diagnosis not present

## 2015-11-09 DIAGNOSIS — I10 Essential (primary) hypertension: Secondary | ICD-10-CM | POA: Diagnosis not present

## 2015-11-10 DIAGNOSIS — E785 Hyperlipidemia, unspecified: Secondary | ICD-10-CM | POA: Diagnosis not present

## 2015-11-10 DIAGNOSIS — K439 Ventral hernia without obstruction or gangrene: Secondary | ICD-10-CM | POA: Diagnosis not present

## 2015-11-10 DIAGNOSIS — I10 Essential (primary) hypertension: Secondary | ICD-10-CM | POA: Diagnosis not present

## 2015-11-10 DIAGNOSIS — M25569 Pain in unspecified knee: Secondary | ICD-10-CM | POA: Diagnosis not present

## 2015-11-10 DIAGNOSIS — G894 Chronic pain syndrome: Secondary | ICD-10-CM | POA: Diagnosis not present

## 2016-01-09 DIAGNOSIS — Z79899 Other long term (current) drug therapy: Secondary | ICD-10-CM | POA: Diagnosis not present

## 2016-01-09 DIAGNOSIS — Q762 Congenital spondylolisthesis: Secondary | ICD-10-CM | POA: Diagnosis not present

## 2016-01-09 DIAGNOSIS — Z79891 Long term (current) use of opiate analgesic: Secondary | ICD-10-CM | POA: Diagnosis not present

## 2016-01-09 DIAGNOSIS — G894 Chronic pain syndrome: Secondary | ICD-10-CM | POA: Diagnosis not present

## 2016-02-06 DIAGNOSIS — M791 Myalgia: Secondary | ICD-10-CM | POA: Diagnosis not present

## 2016-02-06 DIAGNOSIS — Q762 Congenital spondylolisthesis: Secondary | ICD-10-CM | POA: Diagnosis not present

## 2016-02-06 DIAGNOSIS — G894 Chronic pain syndrome: Secondary | ICD-10-CM | POA: Diagnosis not present

## 2016-02-06 DIAGNOSIS — M5136 Other intervertebral disc degeneration, lumbar region: Secondary | ICD-10-CM | POA: Diagnosis not present

## 2016-02-07 DIAGNOSIS — I10 Essential (primary) hypertension: Secondary | ICD-10-CM | POA: Diagnosis not present

## 2016-02-07 DIAGNOSIS — Z23 Encounter for immunization: Secondary | ICD-10-CM | POA: Diagnosis not present

## 2016-03-09 DIAGNOSIS — M5136 Other intervertebral disc degeneration, lumbar region: Secondary | ICD-10-CM | POA: Diagnosis not present

## 2016-03-09 DIAGNOSIS — G894 Chronic pain syndrome: Secondary | ICD-10-CM | POA: Diagnosis not present

## 2016-03-09 DIAGNOSIS — Q762 Congenital spondylolisthesis: Secondary | ICD-10-CM | POA: Diagnosis not present

## 2016-04-09 DIAGNOSIS — G894 Chronic pain syndrome: Secondary | ICD-10-CM | POA: Diagnosis not present

## 2016-04-09 DIAGNOSIS — Z79899 Other long term (current) drug therapy: Secondary | ICD-10-CM | POA: Diagnosis not present

## 2016-04-09 DIAGNOSIS — Z79891 Long term (current) use of opiate analgesic: Secondary | ICD-10-CM | POA: Diagnosis not present

## 2016-04-09 DIAGNOSIS — M791 Myalgia: Secondary | ICD-10-CM | POA: Diagnosis not present

## 2016-04-09 DIAGNOSIS — M545 Low back pain: Secondary | ICD-10-CM | POA: Diagnosis not present

## 2016-04-14 DIAGNOSIS — M545 Low back pain: Secondary | ICD-10-CM | POA: Diagnosis not present

## 2016-04-14 DIAGNOSIS — E785 Hyperlipidemia, unspecified: Secondary | ICD-10-CM | POA: Diagnosis not present

## 2016-04-14 DIAGNOSIS — R05 Cough: Secondary | ICD-10-CM | POA: Diagnosis not present

## 2016-04-14 DIAGNOSIS — D2230 Melanocytic nevi of unspecified part of face: Secondary | ICD-10-CM | POA: Diagnosis not present

## 2016-04-30 ENCOUNTER — Other Ambulatory Visit: Payer: Self-pay | Admitting: Gastroenterology

## 2016-04-30 DIAGNOSIS — R1012 Left upper quadrant pain: Secondary | ICD-10-CM

## 2016-04-30 DIAGNOSIS — R1032 Left lower quadrant pain: Secondary | ICD-10-CM

## 2016-04-30 DIAGNOSIS — Z1211 Encounter for screening for malignant neoplasm of colon: Secondary | ICD-10-CM | POA: Diagnosis not present

## 2016-04-30 DIAGNOSIS — R933 Abnormal findings on diagnostic imaging of other parts of digestive tract: Secondary | ICD-10-CM | POA: Diagnosis not present

## 2016-04-30 DIAGNOSIS — R1033 Periumbilical pain: Secondary | ICD-10-CM | POA: Diagnosis not present

## 2016-05-07 DIAGNOSIS — G894 Chronic pain syndrome: Secondary | ICD-10-CM | POA: Diagnosis not present

## 2016-05-07 DIAGNOSIS — Q762 Congenital spondylolisthesis: Secondary | ICD-10-CM | POA: Diagnosis not present

## 2016-05-10 DIAGNOSIS — L918 Other hypertrophic disorders of the skin: Secondary | ICD-10-CM | POA: Diagnosis not present

## 2016-05-10 DIAGNOSIS — L821 Other seborrheic keratosis: Secondary | ICD-10-CM | POA: Diagnosis not present

## 2016-05-24 DIAGNOSIS — D485 Neoplasm of uncertain behavior of skin: Secondary | ICD-10-CM | POA: Diagnosis not present

## 2016-05-24 DIAGNOSIS — L820 Inflamed seborrheic keratosis: Secondary | ICD-10-CM | POA: Diagnosis not present

## 2016-06-05 DIAGNOSIS — G894 Chronic pain syndrome: Secondary | ICD-10-CM | POA: Diagnosis not present

## 2016-06-05 DIAGNOSIS — M4726 Other spondylosis with radiculopathy, lumbar region: Secondary | ICD-10-CM | POA: Diagnosis not present

## 2016-06-05 DIAGNOSIS — M5136 Other intervertebral disc degeneration, lumbar region: Secondary | ICD-10-CM | POA: Diagnosis not present

## 2016-07-05 DIAGNOSIS — Z01419 Encounter for gynecological examination (general) (routine) without abnormal findings: Secondary | ICD-10-CM | POA: Diagnosis not present

## 2016-07-06 DIAGNOSIS — M4726 Other spondylosis with radiculopathy, lumbar region: Secondary | ICD-10-CM | POA: Diagnosis not present

## 2016-07-06 DIAGNOSIS — M545 Low back pain: Secondary | ICD-10-CM | POA: Diagnosis not present

## 2016-07-06 DIAGNOSIS — G894 Chronic pain syndrome: Secondary | ICD-10-CM | POA: Diagnosis not present

## 2016-07-16 DIAGNOSIS — I10 Essential (primary) hypertension: Secondary | ICD-10-CM | POA: Diagnosis not present

## 2016-07-16 DIAGNOSIS — H524 Presbyopia: Secondary | ICD-10-CM | POA: Diagnosis not present

## 2016-07-20 DIAGNOSIS — N809 Endometriosis, unspecified: Secondary | ICD-10-CM | POA: Diagnosis not present

## 2016-07-20 DIAGNOSIS — N390 Urinary tract infection, site not specified: Secondary | ICD-10-CM | POA: Diagnosis not present

## 2016-07-20 DIAGNOSIS — N840 Polyp of corpus uteri: Secondary | ICD-10-CM | POA: Diagnosis not present

## 2016-08-07 DIAGNOSIS — M5106 Intervertebral disc disorders with myelopathy, lumbar region: Secondary | ICD-10-CM | POA: Diagnosis not present

## 2016-08-07 DIAGNOSIS — G894 Chronic pain syndrome: Secondary | ICD-10-CM | POA: Diagnosis not present

## 2016-11-08 DIAGNOSIS — M545 Low back pain: Secondary | ICD-10-CM | POA: Diagnosis not present

## 2016-11-08 DIAGNOSIS — G894 Chronic pain syndrome: Secondary | ICD-10-CM | POA: Diagnosis not present

## 2016-11-08 DIAGNOSIS — Z4689 Encounter for fitting and adjustment of other specified devices: Secondary | ICD-10-CM | POA: Diagnosis not present

## 2016-11-08 DIAGNOSIS — Z79891 Long term (current) use of opiate analgesic: Secondary | ICD-10-CM | POA: Diagnosis not present

## 2016-11-08 DIAGNOSIS — M791 Myalgia: Secondary | ICD-10-CM | POA: Diagnosis not present

## 2016-11-08 DIAGNOSIS — Z79899 Other long term (current) drug therapy: Secondary | ICD-10-CM | POA: Diagnosis not present

## 2016-11-12 DIAGNOSIS — M542 Cervicalgia: Secondary | ICD-10-CM | POA: Diagnosis not present

## 2016-11-12 DIAGNOSIS — I10 Essential (primary) hypertension: Secondary | ICD-10-CM | POA: Diagnosis not present

## 2016-11-12 DIAGNOSIS — M545 Low back pain: Secondary | ICD-10-CM | POA: Diagnosis not present

## 2016-12-07 DIAGNOSIS — G894 Chronic pain syndrome: Secondary | ICD-10-CM | POA: Diagnosis not present

## 2016-12-07 DIAGNOSIS — M545 Low back pain: Secondary | ICD-10-CM | POA: Diagnosis not present

## 2016-12-07 DIAGNOSIS — Q762 Congenital spondylolisthesis: Secondary | ICD-10-CM | POA: Diagnosis not present

## 2017-01-07 DIAGNOSIS — M791 Myalgia, unspecified site: Secondary | ICD-10-CM | POA: Diagnosis not present

## 2017-01-07 DIAGNOSIS — G894 Chronic pain syndrome: Secondary | ICD-10-CM | POA: Diagnosis not present

## 2017-01-07 DIAGNOSIS — M4726 Other spondylosis with radiculopathy, lumbar region: Secondary | ICD-10-CM | POA: Diagnosis not present

## 2017-01-14 DIAGNOSIS — I10 Essential (primary) hypertension: Secondary | ICD-10-CM | POA: Diagnosis not present

## 2017-01-14 DIAGNOSIS — R100 Acute abdomen: Secondary | ICD-10-CM | POA: Diagnosis not present

## 2017-01-14 DIAGNOSIS — E785 Hyperlipidemia, unspecified: Secondary | ICD-10-CM | POA: Diagnosis not present

## 2017-01-22 DIAGNOSIS — N840 Polyp of corpus uteri: Secondary | ICD-10-CM | POA: Diagnosis not present

## 2017-02-06 DIAGNOSIS — Q762 Congenital spondylolisthesis: Secondary | ICD-10-CM | POA: Diagnosis not present

## 2017-02-06 DIAGNOSIS — G894 Chronic pain syndrome: Secondary | ICD-10-CM | POA: Diagnosis not present

## 2017-02-06 DIAGNOSIS — M5106 Intervertebral disc disorders with myelopathy, lumbar region: Secondary | ICD-10-CM | POA: Diagnosis not present

## 2017-03-08 DIAGNOSIS — G894 Chronic pain syndrome: Secondary | ICD-10-CM | POA: Diagnosis not present

## 2017-03-08 DIAGNOSIS — M4716 Other spondylosis with myelopathy, lumbar region: Secondary | ICD-10-CM | POA: Diagnosis not present

## 2017-03-08 DIAGNOSIS — M5106 Intervertebral disc disorders with myelopathy, lumbar region: Secondary | ICD-10-CM | POA: Diagnosis not present

## 2017-03-08 DIAGNOSIS — M545 Low back pain: Secondary | ICD-10-CM | POA: Diagnosis not present

## 2017-04-08 DIAGNOSIS — M4716 Other spondylosis with myelopathy, lumbar region: Secondary | ICD-10-CM | POA: Diagnosis not present

## 2017-04-08 DIAGNOSIS — M791 Myalgia, unspecified site: Secondary | ICD-10-CM | POA: Diagnosis not present

## 2017-04-08 DIAGNOSIS — G894 Chronic pain syndrome: Secondary | ICD-10-CM | POA: Diagnosis not present

## 2017-04-08 DIAGNOSIS — M5106 Intervertebral disc disorders with myelopathy, lumbar region: Secondary | ICD-10-CM | POA: Diagnosis not present

## 2017-04-08 DIAGNOSIS — M545 Low back pain: Secondary | ICD-10-CM | POA: Diagnosis not present

## 2017-04-12 ENCOUNTER — Other Ambulatory Visit: Payer: Self-pay | Admitting: Orthopaedic Surgery

## 2017-04-12 DIAGNOSIS — G894 Chronic pain syndrome: Secondary | ICD-10-CM

## 2017-04-15 DIAGNOSIS — E785 Hyperlipidemia, unspecified: Secondary | ICD-10-CM | POA: Diagnosis not present

## 2017-04-15 DIAGNOSIS — E559 Vitamin D deficiency, unspecified: Secondary | ICD-10-CM | POA: Diagnosis not present

## 2017-04-15 DIAGNOSIS — I10 Essential (primary) hypertension: Secondary | ICD-10-CM | POA: Diagnosis not present

## 2017-04-15 DIAGNOSIS — K439 Ventral hernia without obstruction or gangrene: Secondary | ICD-10-CM | POA: Diagnosis not present

## 2017-04-15 DIAGNOSIS — M545 Low back pain: Secondary | ICD-10-CM | POA: Diagnosis not present

## 2017-04-26 ENCOUNTER — Other Ambulatory Visit: Payer: Medicare Other

## 2017-05-01 ENCOUNTER — Other Ambulatory Visit: Payer: Self-pay | Admitting: Obstetrics and Gynecology

## 2017-05-01 DIAGNOSIS — Z1231 Encounter for screening mammogram for malignant neoplasm of breast: Secondary | ICD-10-CM

## 2017-05-06 ENCOUNTER — Other Ambulatory Visit: Payer: Self-pay

## 2017-05-07 DIAGNOSIS — Z79899 Other long term (current) drug therapy: Secondary | ICD-10-CM | POA: Diagnosis not present

## 2017-05-07 DIAGNOSIS — M545 Low back pain: Secondary | ICD-10-CM | POA: Diagnosis not present

## 2017-05-07 DIAGNOSIS — M5106 Intervertebral disc disorders with myelopathy, lumbar region: Secondary | ICD-10-CM | POA: Diagnosis not present

## 2017-05-07 DIAGNOSIS — Z79891 Long term (current) use of opiate analgesic: Secondary | ICD-10-CM | POA: Diagnosis not present

## 2017-05-07 DIAGNOSIS — G894 Chronic pain syndrome: Secondary | ICD-10-CM | POA: Diagnosis not present

## 2017-05-15 ENCOUNTER — Other Ambulatory Visit: Payer: Self-pay

## 2017-05-22 ENCOUNTER — Ambulatory Visit
Admission: RE | Admit: 2017-05-22 | Discharge: 2017-05-22 | Disposition: A | Discharge status: Home / Self Care | Payer: Medicare Other | Source: Ambulatory Visit | Attending: Obstetrics and Gynecology | Admitting: Obstetrics and Gynecology

## 2017-05-22 DIAGNOSIS — Z1231 Encounter for screening mammogram for malignant neoplasm of breast: Secondary | ICD-10-CM | POA: Diagnosis not present

## 2017-05-23 DIAGNOSIS — Z1211 Encounter for screening for malignant neoplasm of colon: Secondary | ICD-10-CM | POA: Diagnosis not present

## 2017-06-11 DIAGNOSIS — S5012XA Contusion of left forearm, initial encounter: Secondary | ICD-10-CM | POA: Diagnosis not present

## 2017-06-11 DIAGNOSIS — S80811A Abrasion, right lower leg, initial encounter: Secondary | ICD-10-CM | POA: Diagnosis not present

## 2017-06-11 DIAGNOSIS — S20219A Contusion of unspecified front wall of thorax, initial encounter: Secondary | ICD-10-CM | POA: Diagnosis not present

## 2017-06-11 DIAGNOSIS — S301XXA Contusion of abdominal wall, initial encounter: Secondary | ICD-10-CM | POA: Diagnosis not present

## 2017-06-14 DIAGNOSIS — S20219A Contusion of unspecified front wall of thorax, initial encounter: Secondary | ICD-10-CM | POA: Diagnosis not present

## 2017-06-14 DIAGNOSIS — R0781 Pleurodynia: Secondary | ICD-10-CM | POA: Diagnosis not present

## 2017-06-14 DIAGNOSIS — R0600 Dyspnea, unspecified: Secondary | ICD-10-CM | POA: Diagnosis not present

## 2017-06-14 DIAGNOSIS — M47816 Spondylosis without myelopathy or radiculopathy, lumbar region: Secondary | ICD-10-CM | POA: Diagnosis not present

## 2017-06-14 DIAGNOSIS — M545 Low back pain: Secondary | ICD-10-CM | POA: Diagnosis not present

## 2017-06-20 DIAGNOSIS — Z79899 Other long term (current) drug therapy: Secondary | ICD-10-CM | POA: Diagnosis not present

## 2017-06-20 DIAGNOSIS — M5136 Other intervertebral disc degeneration, lumbar region: Secondary | ICD-10-CM | POA: Diagnosis not present

## 2017-06-20 DIAGNOSIS — M1990 Unspecified osteoarthritis, unspecified site: Secondary | ICD-10-CM | POA: Diagnosis not present

## 2017-06-20 DIAGNOSIS — M545 Low back pain: Secondary | ICD-10-CM | POA: Diagnosis not present

## 2017-06-20 DIAGNOSIS — G8929 Other chronic pain: Secondary | ICD-10-CM | POA: Diagnosis not present

## 2017-07-16 DIAGNOSIS — M545 Low back pain: Secondary | ICD-10-CM | POA: Diagnosis not present

## 2017-07-16 DIAGNOSIS — M5136 Other intervertebral disc degeneration, lumbar region: Secondary | ICD-10-CM | POA: Diagnosis not present

## 2017-07-16 DIAGNOSIS — G8929 Other chronic pain: Secondary | ICD-10-CM | POA: Diagnosis not present

## 2017-07-16 DIAGNOSIS — Z79899 Other long term (current) drug therapy: Secondary | ICD-10-CM | POA: Diagnosis not present

## 2017-08-14 DIAGNOSIS — Z79899 Other long term (current) drug therapy: Secondary | ICD-10-CM | POA: Diagnosis not present

## 2017-08-14 DIAGNOSIS — M5136 Other intervertebral disc degeneration, lumbar region: Secondary | ICD-10-CM | POA: Diagnosis not present

## 2017-09-13 DIAGNOSIS — Z79899 Other long term (current) drug therapy: Secondary | ICD-10-CM | POA: Diagnosis not present

## 2017-09-13 DIAGNOSIS — M5136 Other intervertebral disc degeneration, lumbar region: Secondary | ICD-10-CM | POA: Diagnosis not present

## 2017-09-13 DIAGNOSIS — G894 Chronic pain syndrome: Secondary | ICD-10-CM | POA: Diagnosis not present

## 2017-09-30 DIAGNOSIS — I10 Essential (primary) hypertension: Secondary | ICD-10-CM | POA: Diagnosis not present

## 2017-09-30 DIAGNOSIS — K439 Ventral hernia without obstruction or gangrene: Secondary | ICD-10-CM | POA: Diagnosis not present

## 2017-10-14 DIAGNOSIS — G894 Chronic pain syndrome: Secondary | ICD-10-CM | POA: Diagnosis not present

## 2017-10-14 DIAGNOSIS — Z79899 Other long term (current) drug therapy: Secondary | ICD-10-CM | POA: Diagnosis not present

## 2017-10-14 DIAGNOSIS — M1990 Unspecified osteoarthritis, unspecified site: Secondary | ICD-10-CM | POA: Diagnosis not present

## 2017-10-14 DIAGNOSIS — M5136 Other intervertebral disc degeneration, lumbar region: Secondary | ICD-10-CM | POA: Diagnosis not present

## 2017-11-12 DIAGNOSIS — I10 Essential (primary) hypertension: Secondary | ICD-10-CM | POA: Diagnosis not present

## 2017-11-12 DIAGNOSIS — E785 Hyperlipidemia, unspecified: Secondary | ICD-10-CM | POA: Diagnosis not present

## 2017-11-13 DIAGNOSIS — Z79899 Other long term (current) drug therapy: Secondary | ICD-10-CM | POA: Diagnosis not present

## 2017-11-13 DIAGNOSIS — M5136 Other intervertebral disc degeneration, lumbar region: Secondary | ICD-10-CM | POA: Diagnosis not present

## 2017-11-13 DIAGNOSIS — G894 Chronic pain syndrome: Secondary | ICD-10-CM | POA: Diagnosis not present

## 2017-11-21 DIAGNOSIS — Z1211 Encounter for screening for malignant neoplasm of colon: Secondary | ICD-10-CM | POA: Diagnosis not present

## 2017-12-12 DIAGNOSIS — M5136 Other intervertebral disc degeneration, lumbar region: Secondary | ICD-10-CM | POA: Diagnosis not present

## 2017-12-12 DIAGNOSIS — Z79899 Other long term (current) drug therapy: Secondary | ICD-10-CM | POA: Diagnosis not present

## 2017-12-12 DIAGNOSIS — G894 Chronic pain syndrome: Secondary | ICD-10-CM | POA: Diagnosis not present

## 2018-01-01 DIAGNOSIS — K635 Polyp of colon: Secondary | ICD-10-CM | POA: Diagnosis not present

## 2018-01-01 DIAGNOSIS — Z1211 Encounter for screening for malignant neoplasm of colon: Secondary | ICD-10-CM | POA: Diagnosis not present

## 2018-01-01 DIAGNOSIS — Z01818 Encounter for other preprocedural examination: Secondary | ICD-10-CM | POA: Diagnosis not present

## 2018-01-22 DIAGNOSIS — K529 Noninfective gastroenteritis and colitis, unspecified: Secondary | ICD-10-CM | POA: Diagnosis not present

## 2018-01-22 DIAGNOSIS — Z1211 Encounter for screening for malignant neoplasm of colon: Secondary | ICD-10-CM | POA: Diagnosis not present

## 2018-01-22 DIAGNOSIS — R110 Nausea: Secondary | ICD-10-CM | POA: Diagnosis not present

## 2018-02-10 DIAGNOSIS — Z79899 Other long term (current) drug therapy: Secondary | ICD-10-CM | POA: Diagnosis not present

## 2018-02-10 DIAGNOSIS — M5136 Other intervertebral disc degeneration, lumbar region: Secondary | ICD-10-CM | POA: Diagnosis not present

## 2018-02-10 DIAGNOSIS — M1990 Unspecified osteoarthritis, unspecified site: Secondary | ICD-10-CM | POA: Diagnosis not present

## 2018-02-10 DIAGNOSIS — G894 Chronic pain syndrome: Secondary | ICD-10-CM | POA: Diagnosis not present

## 2018-03-17 DIAGNOSIS — J069 Acute upper respiratory infection, unspecified: Secondary | ICD-10-CM | POA: Diagnosis not present

## 2018-04-11 DIAGNOSIS — Z79899 Other long term (current) drug therapy: Secondary | ICD-10-CM | POA: Diagnosis not present

## 2018-04-11 DIAGNOSIS — G894 Chronic pain syndrome: Secondary | ICD-10-CM | POA: Diagnosis not present

## 2018-04-11 DIAGNOSIS — M5136 Other intervertebral disc degeneration, lumbar region: Secondary | ICD-10-CM | POA: Diagnosis not present

## 2018-05-05 DIAGNOSIS — R079 Chest pain, unspecified: Secondary | ICD-10-CM | POA: Diagnosis not present

## 2018-06-06 DIAGNOSIS — M5136 Other intervertebral disc degeneration, lumbar region: Secondary | ICD-10-CM | POA: Diagnosis not present

## 2018-06-06 DIAGNOSIS — G894 Chronic pain syndrome: Secondary | ICD-10-CM | POA: Diagnosis not present

## 2018-06-06 DIAGNOSIS — Z79899 Other long term (current) drug therapy: Secondary | ICD-10-CM | POA: Diagnosis not present

## 2018-08-07 DIAGNOSIS — G894 Chronic pain syndrome: Secondary | ICD-10-CM | POA: Diagnosis not present

## 2018-08-07 DIAGNOSIS — Z79899 Other long term (current) drug therapy: Secondary | ICD-10-CM | POA: Diagnosis not present

## 2018-08-07 DIAGNOSIS — M5136 Other intervertebral disc degeneration, lumbar region: Secondary | ICD-10-CM | POA: Diagnosis not present

## 2018-08-12 DIAGNOSIS — M545 Low back pain: Secondary | ICD-10-CM | POA: Diagnosis not present

## 2018-09-04 DIAGNOSIS — G894 Chronic pain syndrome: Secondary | ICD-10-CM | POA: Diagnosis not present

## 2018-09-04 DIAGNOSIS — Z79899 Other long term (current) drug therapy: Secondary | ICD-10-CM | POA: Diagnosis not present

## 2018-09-04 DIAGNOSIS — M5136 Other intervertebral disc degeneration, lumbar region: Secondary | ICD-10-CM | POA: Diagnosis not present

## 2018-09-09 DIAGNOSIS — M5136 Other intervertebral disc degeneration, lumbar region: Secondary | ICD-10-CM | POA: Diagnosis not present

## 2018-09-09 DIAGNOSIS — M1990 Unspecified osteoarthritis, unspecified site: Secondary | ICD-10-CM | POA: Diagnosis not present

## 2018-09-09 DIAGNOSIS — M545 Low back pain: Secondary | ICD-10-CM | POA: Diagnosis not present

## 2018-09-09 DIAGNOSIS — Z79899 Other long term (current) drug therapy: Secondary | ICD-10-CM | POA: Diagnosis not present

## 2018-09-09 DIAGNOSIS — G894 Chronic pain syndrome: Secondary | ICD-10-CM | POA: Diagnosis not present

## 2018-10-10 DIAGNOSIS — M1990 Unspecified osteoarthritis, unspecified site: Secondary | ICD-10-CM | POA: Diagnosis not present

## 2018-10-10 DIAGNOSIS — Z79899 Other long term (current) drug therapy: Secondary | ICD-10-CM | POA: Diagnosis not present

## 2018-10-10 DIAGNOSIS — G894 Chronic pain syndrome: Secondary | ICD-10-CM | POA: Diagnosis not present

## 2018-10-10 DIAGNOSIS — M545 Low back pain: Secondary | ICD-10-CM | POA: Diagnosis not present

## 2018-10-10 DIAGNOSIS — M5136 Other intervertebral disc degeneration, lumbar region: Secondary | ICD-10-CM | POA: Diagnosis not present

## 2018-11-05 DIAGNOSIS — Z79899 Other long term (current) drug therapy: Secondary | ICD-10-CM | POA: Diagnosis not present

## 2018-11-05 DIAGNOSIS — Z1159 Encounter for screening for other viral diseases: Secondary | ICD-10-CM | POA: Diagnosis not present

## 2018-11-05 DIAGNOSIS — G894 Chronic pain syndrome: Secondary | ICD-10-CM | POA: Diagnosis not present

## 2018-11-05 DIAGNOSIS — M5136 Other intervertebral disc degeneration, lumbar region: Secondary | ICD-10-CM | POA: Diagnosis not present

## 2018-11-10 DIAGNOSIS — M5136 Other intervertebral disc degeneration, lumbar region: Secondary | ICD-10-CM | POA: Diagnosis not present

## 2018-11-10 DIAGNOSIS — M1990 Unspecified osteoarthritis, unspecified site: Secondary | ICD-10-CM | POA: Diagnosis not present

## 2018-11-10 DIAGNOSIS — M545 Low back pain: Secondary | ICD-10-CM | POA: Diagnosis not present

## 2018-11-10 DIAGNOSIS — G894 Chronic pain syndrome: Secondary | ICD-10-CM | POA: Diagnosis not present

## 2018-11-10 DIAGNOSIS — Z79899 Other long term (current) drug therapy: Secondary | ICD-10-CM | POA: Diagnosis not present

## 2018-12-01 DIAGNOSIS — I10 Essential (primary) hypertension: Secondary | ICD-10-CM | POA: Diagnosis not present

## 2018-12-01 DIAGNOSIS — E538 Deficiency of other specified B group vitamins: Secondary | ICD-10-CM | POA: Diagnosis not present

## 2018-12-01 DIAGNOSIS — Z7189 Other specified counseling: Secondary | ICD-10-CM | POA: Diagnosis not present

## 2018-12-01 DIAGNOSIS — R5383 Other fatigue: Secondary | ICD-10-CM | POA: Diagnosis not present

## 2018-12-01 DIAGNOSIS — D539 Nutritional anemia, unspecified: Secondary | ICD-10-CM | POA: Diagnosis not present

## 2018-12-01 DIAGNOSIS — R7309 Other abnormal glucose: Secondary | ICD-10-CM | POA: Diagnosis not present

## 2018-12-01 DIAGNOSIS — Z1329 Encounter for screening for other suspected endocrine disorder: Secondary | ICD-10-CM | POA: Diagnosis not present

## 2018-12-10 DIAGNOSIS — M1990 Unspecified osteoarthritis, unspecified site: Secondary | ICD-10-CM | POA: Diagnosis not present

## 2018-12-10 DIAGNOSIS — M5136 Other intervertebral disc degeneration, lumbar region: Secondary | ICD-10-CM | POA: Diagnosis not present

## 2018-12-10 DIAGNOSIS — Z79899 Other long term (current) drug therapy: Secondary | ICD-10-CM | POA: Diagnosis not present

## 2018-12-10 DIAGNOSIS — M545 Low back pain: Secondary | ICD-10-CM | POA: Diagnosis not present

## 2018-12-10 DIAGNOSIS — G894 Chronic pain syndrome: Secondary | ICD-10-CM | POA: Diagnosis not present

## 2018-12-23 ENCOUNTER — Other Ambulatory Visit: Payer: Self-pay

## 2018-12-23 NOTE — Patient Outreach (Signed)
[facility]) Care Management  12/23/2018  [name] 07/27/1965 [id number]   Medication Adherence call to [name] Compliant Voice message left with a call back number. [name] is showing past due on Atorvastatin 20 mg under [place].   [facility] Management Direct Dial [phone]  Fax [phone] [email or web address]

## 2019-01-05 DIAGNOSIS — M797 Fibromyalgia: Secondary | ICD-10-CM | POA: Diagnosis not present

## 2019-01-05 DIAGNOSIS — Z1159 Encounter for screening for other viral diseases: Secondary | ICD-10-CM | POA: Diagnosis not present

## 2019-01-05 DIAGNOSIS — Z79899 Other long term (current) drug therapy: Secondary | ICD-10-CM | POA: Diagnosis not present

## 2019-01-05 DIAGNOSIS — G894 Chronic pain syndrome: Secondary | ICD-10-CM | POA: Diagnosis not present

## 2019-01-10 DIAGNOSIS — M5136 Other intervertebral disc degeneration, lumbar region: Secondary | ICD-10-CM | POA: Diagnosis not present

## 2019-01-10 DIAGNOSIS — G894 Chronic pain syndrome: Secondary | ICD-10-CM | POA: Diagnosis not present

## 2019-01-10 DIAGNOSIS — M1990 Unspecified osteoarthritis, unspecified site: Secondary | ICD-10-CM | POA: Diagnosis not present

## 2019-01-10 DIAGNOSIS — M545 Low back pain: Secondary | ICD-10-CM | POA: Diagnosis not present

## 2019-01-10 DIAGNOSIS — Z79899 Other long term (current) drug therapy: Secondary | ICD-10-CM | POA: Diagnosis not present

## 2019-01-26 DIAGNOSIS — M722 Plantar fascial fibromatosis: Secondary | ICD-10-CM | POA: Diagnosis not present

## 2019-01-26 DIAGNOSIS — M7732 Calcaneal spur, left foot: Secondary | ICD-10-CM | POA: Diagnosis not present

## 2019-01-26 DIAGNOSIS — M79671 Pain in right foot: Secondary | ICD-10-CM | POA: Diagnosis not present

## 2019-01-26 DIAGNOSIS — M79672 Pain in left foot: Secondary | ICD-10-CM | POA: Diagnosis not present

## 2019-01-26 DIAGNOSIS — M7731 Calcaneal spur, right foot: Secondary | ICD-10-CM | POA: Diagnosis not present

## 2019-02-09 DIAGNOSIS — G894 Chronic pain syndrome: Secondary | ICD-10-CM | POA: Diagnosis not present

## 2019-02-09 DIAGNOSIS — M545 Low back pain: Secondary | ICD-10-CM | POA: Diagnosis not present

## 2019-02-09 DIAGNOSIS — Z79899 Other long term (current) drug therapy: Secondary | ICD-10-CM | POA: Diagnosis not present

## 2019-02-09 DIAGNOSIS — M5136 Other intervertebral disc degeneration, lumbar region: Secondary | ICD-10-CM | POA: Diagnosis not present

## 2019-02-09 DIAGNOSIS — M1990 Unspecified osteoarthritis, unspecified site: Secondary | ICD-10-CM | POA: Diagnosis not present

## 2019-03-05 DIAGNOSIS — G894 Chronic pain syndrome: Secondary | ICD-10-CM | POA: Diagnosis not present

## 2019-03-05 DIAGNOSIS — Z79899 Other long term (current) drug therapy: Secondary | ICD-10-CM | POA: Diagnosis not present

## 2019-03-05 DIAGNOSIS — M5136 Other intervertebral disc degeneration, lumbar region: Secondary | ICD-10-CM | POA: Diagnosis not present

## 2019-03-05 DIAGNOSIS — M722 Plantar fascial fibromatosis: Secondary | ICD-10-CM | POA: Diagnosis not present

## 2019-03-12 DIAGNOSIS — G894 Chronic pain syndrome: Secondary | ICD-10-CM | POA: Diagnosis not present

## 2019-03-12 DIAGNOSIS — M545 Low back pain: Secondary | ICD-10-CM | POA: Diagnosis not present

## 2019-03-12 DIAGNOSIS — M1990 Unspecified osteoarthritis, unspecified site: Secondary | ICD-10-CM | POA: Diagnosis not present

## 2019-03-12 DIAGNOSIS — M5136 Other intervertebral disc degeneration, lumbar region: Secondary | ICD-10-CM | POA: Diagnosis not present

## 2019-03-12 DIAGNOSIS — Z79899 Other long term (current) drug therapy: Secondary | ICD-10-CM | POA: Diagnosis not present

## 2019-04-02 DIAGNOSIS — Z79899 Other long term (current) drug therapy: Secondary | ICD-10-CM | POA: Diagnosis not present

## 2019-04-02 DIAGNOSIS — Z9189 Other specified personal risk factors, not elsewhere classified: Secondary | ICD-10-CM | POA: Diagnosis not present

## 2019-04-02 DIAGNOSIS — G894 Chronic pain syndrome: Secondary | ICD-10-CM | POA: Diagnosis not present

## 2019-04-02 DIAGNOSIS — M5136 Other intervertebral disc degeneration, lumbar region: Secondary | ICD-10-CM | POA: Diagnosis not present

## 2019-04-06 DIAGNOSIS — Z1231 Encounter for screening mammogram for malignant neoplasm of breast: Secondary | ICD-10-CM | POA: Diagnosis not present

## 2019-04-12 DIAGNOSIS — M5136 Other intervertebral disc degeneration, lumbar region: Secondary | ICD-10-CM | POA: Diagnosis not present

## 2019-04-12 DIAGNOSIS — M545 Low back pain: Secondary | ICD-10-CM | POA: Diagnosis not present

## 2019-04-12 DIAGNOSIS — G894 Chronic pain syndrome: Secondary | ICD-10-CM | POA: Diagnosis not present

## 2019-04-12 DIAGNOSIS — Z79899 Other long term (current) drug therapy: Secondary | ICD-10-CM | POA: Diagnosis not present

## 2019-04-12 DIAGNOSIS — M1990 Unspecified osteoarthritis, unspecified site: Secondary | ICD-10-CM | POA: Diagnosis not present

## 2019-04-28 DIAGNOSIS — E785 Hyperlipidemia, unspecified: Secondary | ICD-10-CM | POA: Diagnosis not present

## 2019-04-28 DIAGNOSIS — Z7189 Other specified counseling: Secondary | ICD-10-CM | POA: Diagnosis not present

## 2019-04-28 DIAGNOSIS — I10 Essential (primary) hypertension: Secondary | ICD-10-CM | POA: Diagnosis not present

## 2019-05-01 DIAGNOSIS — Z79899 Other long term (current) drug therapy: Secondary | ICD-10-CM | POA: Diagnosis not present

## 2019-05-01 DIAGNOSIS — M797 Fibromyalgia: Secondary | ICD-10-CM | POA: Diagnosis not present

## 2019-05-01 DIAGNOSIS — M5136 Other intervertebral disc degeneration, lumbar region: Secondary | ICD-10-CM | POA: Diagnosis not present

## 2019-05-01 DIAGNOSIS — G894 Chronic pain syndrome: Secondary | ICD-10-CM | POA: Diagnosis not present

## 2019-05-10 DIAGNOSIS — Z79899 Other long term (current) drug therapy: Secondary | ICD-10-CM | POA: Diagnosis not present

## 2019-05-10 DIAGNOSIS — G894 Chronic pain syndrome: Secondary | ICD-10-CM | POA: Diagnosis not present

## 2019-05-10 DIAGNOSIS — M1990 Unspecified osteoarthritis, unspecified site: Secondary | ICD-10-CM | POA: Diagnosis not present

## 2019-05-17 DIAGNOSIS — Z79899 Other long term (current) drug therapy: Secondary | ICD-10-CM | POA: Diagnosis not present

## 2019-05-17 DIAGNOSIS — G894 Chronic pain syndrome: Secondary | ICD-10-CM | POA: Diagnosis not present

## 2019-05-17 DIAGNOSIS — M1990 Unspecified osteoarthritis, unspecified site: Secondary | ICD-10-CM | POA: Diagnosis not present

## 2019-06-01 DIAGNOSIS — Z79899 Other long term (current) drug therapy: Secondary | ICD-10-CM | POA: Diagnosis not present

## 2019-06-01 DIAGNOSIS — M5136 Other intervertebral disc degeneration, lumbar region: Secondary | ICD-10-CM | POA: Diagnosis not present

## 2019-06-01 DIAGNOSIS — G894 Chronic pain syndrome: Secondary | ICD-10-CM | POA: Diagnosis not present

## 2019-06-10 DIAGNOSIS — M1990 Unspecified osteoarthritis, unspecified site: Secondary | ICD-10-CM | POA: Diagnosis not present

## 2019-06-10 DIAGNOSIS — G894 Chronic pain syndrome: Secondary | ICD-10-CM | POA: Diagnosis not present

## 2019-06-10 DIAGNOSIS — Z79899 Other long term (current) drug therapy: Secondary | ICD-10-CM | POA: Diagnosis not present

## 2019-06-17 DIAGNOSIS — G894 Chronic pain syndrome: Secondary | ICD-10-CM | POA: Diagnosis not present

## 2019-06-17 DIAGNOSIS — Z79899 Other long term (current) drug therapy: Secondary | ICD-10-CM | POA: Diagnosis not present

## 2019-06-17 DIAGNOSIS — M1990 Unspecified osteoarthritis, unspecified site: Secondary | ICD-10-CM | POA: Diagnosis not present

## 2019-06-29 DIAGNOSIS — Z79899 Other long term (current) drug therapy: Secondary | ICD-10-CM | POA: Diagnosis not present

## 2019-06-29 DIAGNOSIS — G8929 Other chronic pain: Secondary | ICD-10-CM | POA: Diagnosis not present

## 2019-06-29 DIAGNOSIS — M1990 Unspecified osteoarthritis, unspecified site: Secondary | ICD-10-CM | POA: Diagnosis not present

## 2019-06-29 DIAGNOSIS — M545 Low back pain: Secondary | ICD-10-CM | POA: Diagnosis not present

## 2019-06-29 DIAGNOSIS — G894 Chronic pain syndrome: Secondary | ICD-10-CM | POA: Diagnosis not present

## 2019-07-01 DIAGNOSIS — M170 Bilateral primary osteoarthritis of knee: Secondary | ICD-10-CM | POA: Diagnosis not present

## 2019-07-01 DIAGNOSIS — M25561 Pain in right knee: Secondary | ICD-10-CM | POA: Diagnosis not present

## 2019-07-01 DIAGNOSIS — M1711 Unilateral primary osteoarthritis, right knee: Secondary | ICD-10-CM | POA: Diagnosis not present

## 2019-07-01 DIAGNOSIS — M25562 Pain in left knee: Secondary | ICD-10-CM | POA: Diagnosis not present

## 2019-07-03 DIAGNOSIS — E785 Hyperlipidemia, unspecified: Secondary | ICD-10-CM | POA: Diagnosis not present

## 2019-07-03 DIAGNOSIS — I10 Essential (primary) hypertension: Secondary | ICD-10-CM | POA: Diagnosis not present

## 2019-07-03 DIAGNOSIS — M150 Primary generalized (osteo)arthritis: Secondary | ICD-10-CM | POA: Diagnosis not present

## 2019-07-09 DIAGNOSIS — M25561 Pain in right knee: Secondary | ICD-10-CM | POA: Diagnosis not present

## 2019-07-09 DIAGNOSIS — M1711 Unilateral primary osteoarthritis, right knee: Secondary | ICD-10-CM | POA: Diagnosis not present

## 2019-07-10 DIAGNOSIS — Z79899 Other long term (current) drug therapy: Secondary | ICD-10-CM | POA: Diagnosis not present

## 2019-07-10 DIAGNOSIS — G894 Chronic pain syndrome: Secondary | ICD-10-CM | POA: Diagnosis not present

## 2019-07-10 DIAGNOSIS — M1990 Unspecified osteoarthritis, unspecified site: Secondary | ICD-10-CM | POA: Diagnosis not present

## 2019-07-14 DIAGNOSIS — M545 Low back pain: Secondary | ICD-10-CM | POA: Diagnosis not present

## 2019-07-14 DIAGNOSIS — W19XXXA Unspecified fall, initial encounter: Secondary | ICD-10-CM | POA: Diagnosis not present

## 2019-07-14 DIAGNOSIS — M542 Cervicalgia: Secondary | ICD-10-CM | POA: Diagnosis not present

## 2019-07-14 DIAGNOSIS — M79641 Pain in right hand: Secondary | ICD-10-CM | POA: Diagnosis not present

## 2019-07-15 DIAGNOSIS — M7989 Other specified soft tissue disorders: Secondary | ICD-10-CM | POA: Diagnosis not present

## 2019-07-15 DIAGNOSIS — M25531 Pain in right wrist: Secondary | ICD-10-CM | POA: Diagnosis not present

## 2019-07-15 DIAGNOSIS — M170 Bilateral primary osteoarthritis of knee: Secondary | ICD-10-CM | POA: Diagnosis not present

## 2019-07-15 DIAGNOSIS — M25561 Pain in right knee: Secondary | ICD-10-CM | POA: Diagnosis not present

## 2019-07-15 DIAGNOSIS — M1711 Unilateral primary osteoarthritis, right knee: Secondary | ICD-10-CM | POA: Diagnosis not present

## 2019-07-15 DIAGNOSIS — S6991XA Unspecified injury of right wrist, hand and finger(s), initial encounter: Secondary | ICD-10-CM | POA: Diagnosis not present

## 2019-07-15 DIAGNOSIS — M25431 Effusion, right wrist: Secondary | ICD-10-CM | POA: Diagnosis not present

## 2019-07-15 DIAGNOSIS — R269 Unspecified abnormalities of gait and mobility: Secondary | ICD-10-CM | POA: Diagnosis not present

## 2019-07-15 DIAGNOSIS — M79641 Pain in right hand: Secondary | ICD-10-CM | POA: Diagnosis not present

## 2019-07-15 DIAGNOSIS — M25562 Pain in left knee: Secondary | ICD-10-CM | POA: Diagnosis not present

## 2019-07-17 DIAGNOSIS — G894 Chronic pain syndrome: Secondary | ICD-10-CM | POA: Diagnosis not present

## 2019-07-17 DIAGNOSIS — Z79899 Other long term (current) drug therapy: Secondary | ICD-10-CM | POA: Diagnosis not present

## 2019-07-17 DIAGNOSIS — M1990 Unspecified osteoarthritis, unspecified site: Secondary | ICD-10-CM | POA: Diagnosis not present

## 2019-07-22 DIAGNOSIS — M1712 Unilateral primary osteoarthritis, left knee: Secondary | ICD-10-CM | POA: Diagnosis not present

## 2019-07-22 DIAGNOSIS — M25562 Pain in left knee: Secondary | ICD-10-CM | POA: Diagnosis not present

## 2019-07-27 DIAGNOSIS — M791 Myalgia, unspecified site: Secondary | ICD-10-CM | POA: Diagnosis not present

## 2019-07-27 DIAGNOSIS — M79641 Pain in right hand: Secondary | ICD-10-CM | POA: Diagnosis not present

## 2019-07-28 DIAGNOSIS — M25531 Pain in right wrist: Secondary | ICD-10-CM | POA: Diagnosis not present

## 2019-07-28 DIAGNOSIS — M542 Cervicalgia: Secondary | ICD-10-CM | POA: Diagnosis not present

## 2019-07-29 DIAGNOSIS — G894 Chronic pain syndrome: Secondary | ICD-10-CM | POA: Diagnosis not present

## 2019-07-29 DIAGNOSIS — M5136 Other intervertebral disc degeneration, lumbar region: Secondary | ICD-10-CM | POA: Diagnosis not present

## 2019-07-29 DIAGNOSIS — Z79899 Other long term (current) drug therapy: Secondary | ICD-10-CM | POA: Diagnosis not present

## 2019-07-29 DIAGNOSIS — G4700 Insomnia, unspecified: Secondary | ICD-10-CM | POA: Diagnosis not present

## 2019-08-06 DIAGNOSIS — M1712 Unilateral primary osteoarthritis, left knee: Secondary | ICD-10-CM | POA: Diagnosis not present

## 2019-08-06 DIAGNOSIS — M25562 Pain in left knee: Secondary | ICD-10-CM | POA: Diagnosis not present

## 2019-08-10 DIAGNOSIS — M1990 Unspecified osteoarthritis, unspecified site: Secondary | ICD-10-CM | POA: Diagnosis not present

## 2019-08-10 DIAGNOSIS — G894 Chronic pain syndrome: Secondary | ICD-10-CM | POA: Diagnosis not present

## 2019-08-10 DIAGNOSIS — Z79899 Other long term (current) drug therapy: Secondary | ICD-10-CM | POA: Diagnosis not present

## 2019-08-13 DIAGNOSIS — M25562 Pain in left knee: Secondary | ICD-10-CM | POA: Diagnosis not present

## 2019-08-13 DIAGNOSIS — M1712 Unilateral primary osteoarthritis, left knee: Secondary | ICD-10-CM | POA: Diagnosis not present

## 2019-08-17 DIAGNOSIS — M1990 Unspecified osteoarthritis, unspecified site: Secondary | ICD-10-CM | POA: Diagnosis not present

## 2019-08-17 DIAGNOSIS — G894 Chronic pain syndrome: Secondary | ICD-10-CM | POA: Diagnosis not present

## 2019-08-17 DIAGNOSIS — Z79899 Other long term (current) drug therapy: Secondary | ICD-10-CM | POA: Diagnosis not present

## 2019-08-21 DIAGNOSIS — M25531 Pain in right wrist: Secondary | ICD-10-CM | POA: Diagnosis not present

## 2019-08-24 ENCOUNTER — Other Ambulatory Visit: Payer: Self-pay | Admitting: Orthopaedic Surgery

## 2019-08-24 DIAGNOSIS — S6991XA Unspecified injury of right wrist, hand and finger(s), initial encounter: Secondary | ICD-10-CM

## 2019-08-24 DIAGNOSIS — M542 Cervicalgia: Secondary | ICD-10-CM

## 2019-08-26 DIAGNOSIS — G894 Chronic pain syndrome: Secondary | ICD-10-CM | POA: Diagnosis not present

## 2019-08-26 DIAGNOSIS — G8929 Other chronic pain: Secondary | ICD-10-CM | POA: Diagnosis not present

## 2019-08-26 DIAGNOSIS — Z79899 Other long term (current) drug therapy: Secondary | ICD-10-CM | POA: Diagnosis not present

## 2019-08-26 DIAGNOSIS — M545 Low back pain: Secondary | ICD-10-CM | POA: Diagnosis not present

## 2019-09-09 DIAGNOSIS — Z79899 Other long term (current) drug therapy: Secondary | ICD-10-CM | POA: Diagnosis not present

## 2019-09-09 DIAGNOSIS — G894 Chronic pain syndrome: Secondary | ICD-10-CM | POA: Diagnosis not present

## 2019-09-09 DIAGNOSIS — M1990 Unspecified osteoarthritis, unspecified site: Secondary | ICD-10-CM | POA: Diagnosis not present

## 2019-09-16 DIAGNOSIS — Z79899 Other long term (current) drug therapy: Secondary | ICD-10-CM | POA: Diagnosis not present

## 2019-09-16 DIAGNOSIS — M1990 Unspecified osteoarthritis, unspecified site: Secondary | ICD-10-CM | POA: Diagnosis not present

## 2019-09-16 DIAGNOSIS — G894 Chronic pain syndrome: Secondary | ICD-10-CM | POA: Diagnosis not present

## 2019-09-23 ENCOUNTER — Ambulatory Visit
Admission: RE | Admit: 2019-09-23 | Discharge: 2019-09-23 | Disposition: A | Discharge status: Home / Self Care | Payer: Medicare Other | Source: Ambulatory Visit | Attending: Orthopaedic Surgery | Admitting: Orthopaedic Surgery

## 2019-09-23 ENCOUNTER — Other Ambulatory Visit: Payer: Self-pay | Admitting: Orthopaedic Surgery

## 2019-09-23 ENCOUNTER — Other Ambulatory Visit: Payer: Self-pay

## 2019-09-23 DIAGNOSIS — S53441A Ulnar collateral ligament sprain of right elbow, initial encounter: Secondary | ICD-10-CM | POA: Diagnosis not present

## 2019-09-23 DIAGNOSIS — S6991XA Unspecified injury of right wrist, hand and finger(s), initial encounter: Secondary | ICD-10-CM

## 2019-09-23 DIAGNOSIS — R600 Localized edema: Secondary | ICD-10-CM | POA: Diagnosis not present

## 2019-09-23 DIAGNOSIS — M7989 Other specified soft tissue disorders: Secondary | ICD-10-CM | POA: Diagnosis not present

## 2019-09-23 DIAGNOSIS — M19041 Primary osteoarthritis, right hand: Secondary | ICD-10-CM | POA: Diagnosis not present

## 2019-09-25 DIAGNOSIS — G8929 Other chronic pain: Secondary | ICD-10-CM | POA: Diagnosis not present

## 2019-09-25 DIAGNOSIS — G894 Chronic pain syndrome: Secondary | ICD-10-CM | POA: Diagnosis not present

## 2019-09-25 DIAGNOSIS — M545 Low back pain: Secondary | ICD-10-CM | POA: Diagnosis not present

## 2019-09-25 DIAGNOSIS — Z79899 Other long term (current) drug therapy: Secondary | ICD-10-CM | POA: Diagnosis not present

## 2019-09-25 DIAGNOSIS — M129 Arthropathy, unspecified: Secondary | ICD-10-CM | POA: Diagnosis not present

## 2019-09-26 ENCOUNTER — Other Ambulatory Visit: Payer: Medicare Other

## 2019-10-10 DIAGNOSIS — G894 Chronic pain syndrome: Secondary | ICD-10-CM | POA: Diagnosis not present

## 2019-10-10 DIAGNOSIS — M1990 Unspecified osteoarthritis, unspecified site: Secondary | ICD-10-CM | POA: Diagnosis not present

## 2019-10-10 DIAGNOSIS — Z79899 Other long term (current) drug therapy: Secondary | ICD-10-CM | POA: Diagnosis not present

## 2019-10-13 DIAGNOSIS — Z79899 Other long term (current) drug therapy: Secondary | ICD-10-CM | POA: Diagnosis not present

## 2019-10-13 DIAGNOSIS — Z20822 Contact with and (suspected) exposure to covid-19: Secondary | ICD-10-CM | POA: Diagnosis not present

## 2019-10-13 DIAGNOSIS — M5136 Other intervertebral disc degeneration, lumbar region: Secondary | ICD-10-CM | POA: Diagnosis not present

## 2019-10-13 DIAGNOSIS — M797 Fibromyalgia: Secondary | ICD-10-CM | POA: Diagnosis not present

## 2019-10-13 DIAGNOSIS — M1990 Unspecified osteoarthritis, unspecified site: Secondary | ICD-10-CM | POA: Diagnosis not present

## 2019-10-13 DIAGNOSIS — M129 Arthropathy, unspecified: Secondary | ICD-10-CM | POA: Diagnosis not present

## 2019-10-13 DIAGNOSIS — G894 Chronic pain syndrome: Secondary | ICD-10-CM | POA: Diagnosis not present

## 2019-10-13 DIAGNOSIS — E559 Vitamin D deficiency, unspecified: Secondary | ICD-10-CM | POA: Diagnosis not present

## 2019-10-17 DIAGNOSIS — G894 Chronic pain syndrome: Secondary | ICD-10-CM | POA: Diagnosis not present

## 2019-10-17 DIAGNOSIS — Z79899 Other long term (current) drug therapy: Secondary | ICD-10-CM | POA: Diagnosis not present

## 2019-10-17 DIAGNOSIS — M1990 Unspecified osteoarthritis, unspecified site: Secondary | ICD-10-CM | POA: Diagnosis not present

## 2019-10-20 DIAGNOSIS — M25531 Pain in right wrist: Secondary | ICD-10-CM | POA: Diagnosis not present

## 2019-10-23 DIAGNOSIS — S66811A Strain of other specified muscles, fascia and tendons at wrist and hand level, right hand, initial encounter: Secondary | ICD-10-CM | POA: Diagnosis not present

## 2019-10-23 DIAGNOSIS — R52 Pain, unspecified: Secondary | ICD-10-CM | POA: Diagnosis not present

## 2019-11-03 DIAGNOSIS — M150 Primary generalized (osteo)arthritis: Secondary | ICD-10-CM | POA: Diagnosis not present

## 2019-11-03 DIAGNOSIS — E785 Hyperlipidemia, unspecified: Secondary | ICD-10-CM | POA: Diagnosis not present

## 2019-11-03 DIAGNOSIS — I10 Essential (primary) hypertension: Secondary | ICD-10-CM | POA: Diagnosis not present

## 2019-11-05 ENCOUNTER — Other Ambulatory Visit: Payer: Self-pay | Admitting: Orthopedic Surgery

## 2019-11-05 DIAGNOSIS — S66811A Strain of other specified muscles, fascia and tendons at wrist and hand level, right hand, initial encounter: Secondary | ICD-10-CM

## 2019-11-10 DIAGNOSIS — M1990 Unspecified osteoarthritis, unspecified site: Secondary | ICD-10-CM | POA: Diagnosis not present

## 2019-11-10 DIAGNOSIS — M5136 Other intervertebral disc degeneration, lumbar region: Secondary | ICD-10-CM | POA: Diagnosis not present

## 2019-11-10 DIAGNOSIS — M797 Fibromyalgia: Secondary | ICD-10-CM | POA: Diagnosis not present

## 2019-11-10 DIAGNOSIS — Z79899 Other long term (current) drug therapy: Secondary | ICD-10-CM | POA: Diagnosis not present

## 2019-11-10 DIAGNOSIS — G894 Chronic pain syndrome: Secondary | ICD-10-CM | POA: Diagnosis not present

## 2019-11-11 ENCOUNTER — Other Ambulatory Visit: Payer: Medicare Other

## 2019-11-13 DIAGNOSIS — S66811D Strain of other specified muscles, fascia and tendons at wrist and hand level, right hand, subsequent encounter: Secondary | ICD-10-CM | POA: Diagnosis not present

## 2019-11-13 DIAGNOSIS — M25531 Pain in right wrist: Secondary | ICD-10-CM | POA: Diagnosis not present

## 2019-11-13 DIAGNOSIS — M79641 Pain in right hand: Secondary | ICD-10-CM | POA: Diagnosis not present

## 2019-11-17 DIAGNOSIS — M1990 Unspecified osteoarthritis, unspecified site: Secondary | ICD-10-CM | POA: Diagnosis not present

## 2019-11-17 DIAGNOSIS — G894 Chronic pain syndrome: Secondary | ICD-10-CM | POA: Diagnosis not present

## 2019-11-17 DIAGNOSIS — Z79899 Other long term (current) drug therapy: Secondary | ICD-10-CM | POA: Diagnosis not present

## 2019-11-19 DIAGNOSIS — M170 Bilateral primary osteoarthritis of knee: Secondary | ICD-10-CM | POA: Diagnosis not present

## 2019-11-19 DIAGNOSIS — M25562 Pain in left knee: Secondary | ICD-10-CM | POA: Diagnosis not present

## 2019-11-19 DIAGNOSIS — M25561 Pain in right knee: Secondary | ICD-10-CM | POA: Diagnosis not present

## 2019-12-08 DIAGNOSIS — Z79899 Other long term (current) drug therapy: Secondary | ICD-10-CM | POA: Diagnosis not present

## 2019-12-08 DIAGNOSIS — G894 Chronic pain syndrome: Secondary | ICD-10-CM | POA: Diagnosis not present

## 2019-12-08 DIAGNOSIS — M5136 Other intervertebral disc degeneration, lumbar region: Secondary | ICD-10-CM | POA: Diagnosis not present

## 2019-12-08 DIAGNOSIS — Z23 Encounter for immunization: Secondary | ICD-10-CM | POA: Diagnosis not present

## 2019-12-08 DIAGNOSIS — M797 Fibromyalgia: Secondary | ICD-10-CM | POA: Diagnosis not present

## 2019-12-08 DIAGNOSIS — M1990 Unspecified osteoarthritis, unspecified site: Secondary | ICD-10-CM | POA: Diagnosis not present

## 2019-12-10 DIAGNOSIS — Z79899 Other long term (current) drug therapy: Secondary | ICD-10-CM | POA: Diagnosis not present

## 2019-12-10 DIAGNOSIS — M1990 Unspecified osteoarthritis, unspecified site: Secondary | ICD-10-CM | POA: Diagnosis not present

## 2019-12-10 DIAGNOSIS — G894 Chronic pain syndrome: Secondary | ICD-10-CM | POA: Diagnosis not present

## 2019-12-22 DIAGNOSIS — I10 Essential (primary) hypertension: Secondary | ICD-10-CM | POA: Diagnosis not present

## 2019-12-22 DIAGNOSIS — E785 Hyperlipidemia, unspecified: Secondary | ICD-10-CM | POA: Diagnosis not present

## 2020-01-02 DIAGNOSIS — I10 Essential (primary) hypertension: Secondary | ICD-10-CM | POA: Diagnosis not present

## 2020-01-02 DIAGNOSIS — E785 Hyperlipidemia, unspecified: Secondary | ICD-10-CM | POA: Diagnosis not present

## 2020-01-02 DIAGNOSIS — M150 Primary generalized (osteo)arthritis: Secondary | ICD-10-CM | POA: Diagnosis not present

## 2020-01-05 DIAGNOSIS — M79671 Pain in right foot: Secondary | ICD-10-CM | POA: Diagnosis not present

## 2020-01-05 DIAGNOSIS — G603 Idiopathic progressive neuropathy: Secondary | ICD-10-CM | POA: Diagnosis not present

## 2020-01-05 DIAGNOSIS — M722 Plantar fascial fibromatosis: Secondary | ICD-10-CM | POA: Diagnosis not present

## 2020-01-05 DIAGNOSIS — M7731 Calcaneal spur, right foot: Secondary | ICD-10-CM | POA: Diagnosis not present

## 2020-01-05 DIAGNOSIS — M7732 Calcaneal spur, left foot: Secondary | ICD-10-CM | POA: Diagnosis not present

## 2020-01-06 DIAGNOSIS — Z79899 Other long term (current) drug therapy: Secondary | ICD-10-CM | POA: Diagnosis not present

## 2020-01-06 DIAGNOSIS — M5136 Other intervertebral disc degeneration, lumbar region: Secondary | ICD-10-CM | POA: Diagnosis not present

## 2020-01-06 DIAGNOSIS — M1990 Unspecified osteoarthritis, unspecified site: Secondary | ICD-10-CM | POA: Diagnosis not present

## 2020-01-06 DIAGNOSIS — M797 Fibromyalgia: Secondary | ICD-10-CM | POA: Diagnosis not present

## 2020-01-06 DIAGNOSIS — G894 Chronic pain syndrome: Secondary | ICD-10-CM | POA: Diagnosis not present

## 2020-01-10 DIAGNOSIS — G894 Chronic pain syndrome: Secondary | ICD-10-CM | POA: Diagnosis not present

## 2020-01-10 DIAGNOSIS — Z79899 Other long term (current) drug therapy: Secondary | ICD-10-CM | POA: Diagnosis not present

## 2020-01-10 DIAGNOSIS — M1990 Unspecified osteoarthritis, unspecified site: Secondary | ICD-10-CM | POA: Diagnosis not present

## 2020-01-11 DIAGNOSIS — Z1231 Encounter for screening mammogram for malignant neoplasm of breast: Secondary | ICD-10-CM | POA: Diagnosis not present

## 2020-02-01 DIAGNOSIS — S66811A Strain of other specified muscles, fascia and tendons at wrist and hand level, right hand, initial encounter: Secondary | ICD-10-CM | POA: Diagnosis not present

## 2020-02-01 DIAGNOSIS — M65841 Other synovitis and tenosynovitis, right hand: Secondary | ICD-10-CM | POA: Diagnosis not present

## 2020-02-01 DIAGNOSIS — M25441 Effusion, right hand: Secondary | ICD-10-CM | POA: Diagnosis not present

## 2020-02-02 DIAGNOSIS — M150 Primary generalized (osteo)arthritis: Secondary | ICD-10-CM | POA: Diagnosis not present

## 2020-02-02 DIAGNOSIS — I10 Essential (primary) hypertension: Secondary | ICD-10-CM | POA: Diagnosis not present

## 2020-02-02 DIAGNOSIS — E785 Hyperlipidemia, unspecified: Secondary | ICD-10-CM | POA: Diagnosis not present

## 2020-02-05 DIAGNOSIS — M5136 Other intervertebral disc degeneration, lumbar region: Secondary | ICD-10-CM | POA: Diagnosis not present

## 2020-02-05 DIAGNOSIS — G894 Chronic pain syndrome: Secondary | ICD-10-CM | POA: Diagnosis not present

## 2020-02-05 DIAGNOSIS — M1990 Unspecified osteoarthritis, unspecified site: Secondary | ICD-10-CM | POA: Diagnosis not present

## 2020-02-05 DIAGNOSIS — Z79899 Other long term (current) drug therapy: Secondary | ICD-10-CM | POA: Diagnosis not present

## 2020-02-05 DIAGNOSIS — M797 Fibromyalgia: Secondary | ICD-10-CM | POA: Diagnosis not present

## 2020-02-09 DIAGNOSIS — M1990 Unspecified osteoarthritis, unspecified site: Secondary | ICD-10-CM | POA: Diagnosis not present

## 2020-02-09 DIAGNOSIS — G894 Chronic pain syndrome: Secondary | ICD-10-CM | POA: Diagnosis not present

## 2020-02-09 DIAGNOSIS — Z79899 Other long term (current) drug therapy: Secondary | ICD-10-CM | POA: Diagnosis not present

## 2020-03-02 DIAGNOSIS — M5136 Other intervertebral disc degeneration, lumbar region: Secondary | ICD-10-CM | POA: Diagnosis not present

## 2020-03-02 DIAGNOSIS — Z79899 Other long term (current) drug therapy: Secondary | ICD-10-CM | POA: Diagnosis not present

## 2020-03-02 DIAGNOSIS — G894 Chronic pain syndrome: Secondary | ICD-10-CM | POA: Diagnosis not present

## 2020-03-02 DIAGNOSIS — M797 Fibromyalgia: Secondary | ICD-10-CM | POA: Diagnosis not present

## 2020-03-02 DIAGNOSIS — M1990 Unspecified osteoarthritis, unspecified site: Secondary | ICD-10-CM | POA: Diagnosis not present

## 2020-03-04 DIAGNOSIS — E785 Hyperlipidemia, unspecified: Secondary | ICD-10-CM | POA: Diagnosis not present

## 2020-03-04 DIAGNOSIS — M150 Primary generalized (osteo)arthritis: Secondary | ICD-10-CM | POA: Diagnosis not present

## 2020-03-04 DIAGNOSIS — I10 Essential (primary) hypertension: Secondary | ICD-10-CM | POA: Diagnosis not present

## 2020-03-11 DIAGNOSIS — Z79899 Other long term (current) drug therapy: Secondary | ICD-10-CM | POA: Diagnosis not present

## 2020-03-11 DIAGNOSIS — M1990 Unspecified osteoarthritis, unspecified site: Secondary | ICD-10-CM | POA: Diagnosis not present

## 2020-03-11 DIAGNOSIS — G894 Chronic pain syndrome: Secondary | ICD-10-CM | POA: Diagnosis not present

## 2020-03-18 DIAGNOSIS — S66811A Strain of other specified muscles, fascia and tendons at wrist and hand level, right hand, initial encounter: Secondary | ICD-10-CM | POA: Diagnosis not present

## 2020-03-28 DIAGNOSIS — M5136 Other intervertebral disc degeneration, lumbar region: Secondary | ICD-10-CM | POA: Diagnosis not present

## 2020-03-28 DIAGNOSIS — G894 Chronic pain syndrome: Secondary | ICD-10-CM | POA: Diagnosis not present

## 2020-03-28 DIAGNOSIS — M797 Fibromyalgia: Secondary | ICD-10-CM | POA: Diagnosis not present

## 2020-03-28 DIAGNOSIS — M1990 Unspecified osteoarthritis, unspecified site: Secondary | ICD-10-CM | POA: Diagnosis not present

## 2020-03-28 DIAGNOSIS — Z79899 Other long term (current) drug therapy: Secondary | ICD-10-CM | POA: Diagnosis not present

## 2020-04-01 DIAGNOSIS — M21162 Varus deformity, not elsewhere classified, left knee: Secondary | ICD-10-CM | POA: Diagnosis not present

## 2020-04-01 DIAGNOSIS — M21161 Varus deformity, not elsewhere classified, right knee: Secondary | ICD-10-CM | POA: Diagnosis not present

## 2020-04-01 DIAGNOSIS — M25562 Pain in left knee: Secondary | ICD-10-CM | POA: Diagnosis not present

## 2020-04-01 DIAGNOSIS — M170 Bilateral primary osteoarthritis of knee: Secondary | ICD-10-CM | POA: Diagnosis not present

## 2020-04-01 DIAGNOSIS — M25361 Other instability, right knee: Secondary | ICD-10-CM | POA: Diagnosis not present

## 2020-04-01 DIAGNOSIS — M25561 Pain in right knee: Secondary | ICD-10-CM | POA: Diagnosis not present

## 2020-04-11 DIAGNOSIS — G894 Chronic pain syndrome: Secondary | ICD-10-CM | POA: Diagnosis not present

## 2020-04-11 DIAGNOSIS — M1990 Unspecified osteoarthritis, unspecified site: Secondary | ICD-10-CM | POA: Diagnosis not present

## 2020-04-11 DIAGNOSIS — Z79899 Other long term (current) drug therapy: Secondary | ICD-10-CM | POA: Diagnosis not present

## 2020-04-21 DIAGNOSIS — Z79899 Other long term (current) drug therapy: Secondary | ICD-10-CM | POA: Diagnosis not present

## 2020-04-21 DIAGNOSIS — G894 Chronic pain syndrome: Secondary | ICD-10-CM | POA: Diagnosis not present

## 2020-04-21 DIAGNOSIS — M797 Fibromyalgia: Secondary | ICD-10-CM | POA: Diagnosis not present

## 2020-04-21 DIAGNOSIS — M5136 Other intervertebral disc degeneration, lumbar region: Secondary | ICD-10-CM | POA: Diagnosis not present

## 2020-04-21 DIAGNOSIS — M1990 Unspecified osteoarthritis, unspecified site: Secondary | ICD-10-CM | POA: Diagnosis not present

## 2020-05-02 DIAGNOSIS — I10 Essential (primary) hypertension: Secondary | ICD-10-CM | POA: Diagnosis not present

## 2020-05-02 DIAGNOSIS — M150 Primary generalized (osteo)arthritis: Secondary | ICD-10-CM | POA: Diagnosis not present

## 2020-05-02 DIAGNOSIS — E785 Hyperlipidemia, unspecified: Secondary | ICD-10-CM | POA: Diagnosis not present

## 2020-05-03 DIAGNOSIS — Z0001 Encounter for general adult medical examination with abnormal findings: Secondary | ICD-10-CM | POA: Diagnosis not present

## 2020-05-03 DIAGNOSIS — E785 Hyperlipidemia, unspecified: Secondary | ICD-10-CM | POA: Diagnosis not present

## 2020-05-03 DIAGNOSIS — R600 Localized edema: Secondary | ICD-10-CM | POA: Diagnosis not present

## 2020-05-03 DIAGNOSIS — I10 Essential (primary) hypertension: Secondary | ICD-10-CM | POA: Diagnosis not present

## 2020-05-09 DIAGNOSIS — M1990 Unspecified osteoarthritis, unspecified site: Secondary | ICD-10-CM | POA: Diagnosis not present

## 2020-05-09 DIAGNOSIS — G894 Chronic pain syndrome: Secondary | ICD-10-CM | POA: Diagnosis not present

## 2020-05-09 DIAGNOSIS — Z79899 Other long term (current) drug therapy: Secondary | ICD-10-CM | POA: Diagnosis not present

## 2020-05-10 DIAGNOSIS — Z79899 Other long term (current) drug therapy: Secondary | ICD-10-CM | POA: Diagnosis not present

## 2020-05-10 DIAGNOSIS — Z20822 Contact with and (suspected) exposure to covid-19: Secondary | ICD-10-CM | POA: Diagnosis not present

## 2020-05-10 DIAGNOSIS — M5136 Other intervertebral disc degeneration, lumbar region: Secondary | ICD-10-CM | POA: Diagnosis not present

## 2020-05-10 DIAGNOSIS — G894 Chronic pain syndrome: Secondary | ICD-10-CM | POA: Diagnosis not present

## 2020-05-10 DIAGNOSIS — M1990 Unspecified osteoarthritis, unspecified site: Secondary | ICD-10-CM | POA: Diagnosis not present

## 2020-05-10 DIAGNOSIS — M797 Fibromyalgia: Secondary | ICD-10-CM | POA: Diagnosis not present

## 2020-05-17 DIAGNOSIS — I10 Essential (primary) hypertension: Secondary | ICD-10-CM | POA: Diagnosis not present

## 2020-05-19 DIAGNOSIS — Z79899 Other long term (current) drug therapy: Secondary | ICD-10-CM | POA: Diagnosis not present

## 2020-05-19 DIAGNOSIS — M797 Fibromyalgia: Secondary | ICD-10-CM | POA: Diagnosis not present

## 2020-05-19 DIAGNOSIS — G894 Chronic pain syndrome: Secondary | ICD-10-CM | POA: Diagnosis not present

## 2020-05-19 DIAGNOSIS — M5136 Other intervertebral disc degeneration, lumbar region: Secondary | ICD-10-CM | POA: Diagnosis not present

## 2020-05-19 DIAGNOSIS — M1990 Unspecified osteoarthritis, unspecified site: Secondary | ICD-10-CM | POA: Diagnosis not present

## 2020-05-30 DIAGNOSIS — S66811A Strain of other specified muscles, fascia and tendons at wrist and hand level, right hand, initial encounter: Secondary | ICD-10-CM | POA: Diagnosis not present

## 2020-06-03 DIAGNOSIS — I708 Atherosclerosis of other arteries: Secondary | ICD-10-CM | POA: Diagnosis not present

## 2020-06-03 DIAGNOSIS — M5136 Other intervertebral disc degeneration, lumbar region: Secondary | ICD-10-CM | POA: Diagnosis not present

## 2020-06-09 DIAGNOSIS — M1990 Unspecified osteoarthritis, unspecified site: Secondary | ICD-10-CM | POA: Diagnosis not present

## 2020-06-09 DIAGNOSIS — Z79899 Other long term (current) drug therapy: Secondary | ICD-10-CM | POA: Diagnosis not present

## 2020-06-09 DIAGNOSIS — G894 Chronic pain syndrome: Secondary | ICD-10-CM | POA: Diagnosis not present

## 2020-06-14 DIAGNOSIS — M1990 Unspecified osteoarthritis, unspecified site: Secondary | ICD-10-CM | POA: Diagnosis not present

## 2020-06-14 DIAGNOSIS — M797 Fibromyalgia: Secondary | ICD-10-CM | POA: Diagnosis not present

## 2020-06-14 DIAGNOSIS — G894 Chronic pain syndrome: Secondary | ICD-10-CM | POA: Diagnosis not present

## 2020-06-14 DIAGNOSIS — M5136 Other intervertebral disc degeneration, lumbar region: Secondary | ICD-10-CM | POA: Diagnosis not present

## 2020-06-14 DIAGNOSIS — Z79899 Other long term (current) drug therapy: Secondary | ICD-10-CM | POA: Diagnosis not present

## 2020-06-16 DIAGNOSIS — I10 Essential (primary) hypertension: Secondary | ICD-10-CM | POA: Diagnosis not present

## 2020-06-24 DIAGNOSIS — R1900 Intra-abdominal and pelvic swelling, mass and lump, unspecified site: Secondary | ICD-10-CM | POA: Diagnosis not present

## 2020-06-24 DIAGNOSIS — R1909 Other intra-abdominal and pelvic swelling, mass and lump: Secondary | ICD-10-CM | POA: Diagnosis not present

## 2020-07-09 DIAGNOSIS — M1990 Unspecified osteoarthritis, unspecified site: Secondary | ICD-10-CM | POA: Diagnosis not present

## 2020-07-09 DIAGNOSIS — Z79899 Other long term (current) drug therapy: Secondary | ICD-10-CM | POA: Diagnosis not present

## 2020-07-09 DIAGNOSIS — G894 Chronic pain syndrome: Secondary | ICD-10-CM | POA: Diagnosis not present

## 2020-07-11 DIAGNOSIS — R1900 Intra-abdominal and pelvic swelling, mass and lump, unspecified site: Secondary | ICD-10-CM | POA: Diagnosis not present

## 2020-07-13 DIAGNOSIS — G894 Chronic pain syndrome: Secondary | ICD-10-CM | POA: Diagnosis not present

## 2020-07-13 DIAGNOSIS — Z79899 Other long term (current) drug therapy: Secondary | ICD-10-CM | POA: Diagnosis not present

## 2020-07-13 DIAGNOSIS — M5136 Other intervertebral disc degeneration, lumbar region: Secondary | ICD-10-CM | POA: Diagnosis not present

## 2020-07-13 DIAGNOSIS — M797 Fibromyalgia: Secondary | ICD-10-CM | POA: Diagnosis not present

## 2020-07-13 DIAGNOSIS — M1990 Unspecified osteoarthritis, unspecified site: Secondary | ICD-10-CM | POA: Diagnosis not present

## 2020-07-28 DIAGNOSIS — I10 Essential (primary) hypertension: Secondary | ICD-10-CM | POA: Diagnosis not present

## 2020-08-09 DIAGNOSIS — Z79899 Other long term (current) drug therapy: Secondary | ICD-10-CM | POA: Diagnosis not present

## 2020-08-09 DIAGNOSIS — M1990 Unspecified osteoarthritis, unspecified site: Secondary | ICD-10-CM | POA: Diagnosis not present

## 2020-08-09 DIAGNOSIS — G894 Chronic pain syndrome: Secondary | ICD-10-CM | POA: Diagnosis not present

## 2020-08-12 DIAGNOSIS — Z79899 Other long term (current) drug therapy: Secondary | ICD-10-CM | POA: Diagnosis not present

## 2020-08-12 DIAGNOSIS — M1990 Unspecified osteoarthritis, unspecified site: Secondary | ICD-10-CM | POA: Diagnosis not present

## 2020-08-12 DIAGNOSIS — M797 Fibromyalgia: Secondary | ICD-10-CM | POA: Diagnosis not present

## 2020-08-12 DIAGNOSIS — G894 Chronic pain syndrome: Secondary | ICD-10-CM | POA: Diagnosis not present

## 2020-08-12 DIAGNOSIS — M5136 Other intervertebral disc degeneration, lumbar region: Secondary | ICD-10-CM | POA: Diagnosis not present

## 2020-08-26 DIAGNOSIS — S66811A Strain of other specified muscles, fascia and tendons at wrist and hand level, right hand, initial encounter: Secondary | ICD-10-CM | POA: Diagnosis not present

## 2020-09-01 DIAGNOSIS — M150 Primary generalized (osteo)arthritis: Secondary | ICD-10-CM | POA: Diagnosis not present

## 2020-09-01 DIAGNOSIS — E785 Hyperlipidemia, unspecified: Secondary | ICD-10-CM | POA: Diagnosis not present

## 2020-09-01 DIAGNOSIS — I10 Essential (primary) hypertension: Secondary | ICD-10-CM | POA: Diagnosis not present

## 2020-09-08 DIAGNOSIS — M5136 Other intervertebral disc degeneration, lumbar region: Secondary | ICD-10-CM | POA: Diagnosis not present

## 2020-09-08 DIAGNOSIS — G894 Chronic pain syndrome: Secondary | ICD-10-CM | POA: Diagnosis not present

## 2020-09-08 DIAGNOSIS — Z79899 Other long term (current) drug therapy: Secondary | ICD-10-CM | POA: Diagnosis not present

## 2020-09-08 DIAGNOSIS — M797 Fibromyalgia: Secondary | ICD-10-CM | POA: Diagnosis not present

## 2020-09-08 DIAGNOSIS — M1990 Unspecified osteoarthritis, unspecified site: Secondary | ICD-10-CM | POA: Diagnosis not present

## 2020-09-11 DIAGNOSIS — Z79899 Other long term (current) drug therapy: Secondary | ICD-10-CM | POA: Diagnosis not present

## 2020-09-26 DIAGNOSIS — U071 COVID-19: Secondary | ICD-10-CM | POA: Diagnosis not present

## 2020-10-06 DIAGNOSIS — Z79899 Other long term (current) drug therapy: Secondary | ICD-10-CM | POA: Diagnosis not present

## 2020-10-06 DIAGNOSIS — M5136 Other intervertebral disc degeneration, lumbar region: Secondary | ICD-10-CM | POA: Diagnosis not present

## 2020-10-06 DIAGNOSIS — M1990 Unspecified osteoarthritis, unspecified site: Secondary | ICD-10-CM | POA: Diagnosis not present

## 2020-10-06 DIAGNOSIS — M797 Fibromyalgia: Secondary | ICD-10-CM | POA: Diagnosis not present

## 2020-10-06 DIAGNOSIS — G894 Chronic pain syndrome: Secondary | ICD-10-CM | POA: Diagnosis not present

## 2020-10-09 DIAGNOSIS — G894 Chronic pain syndrome: Secondary | ICD-10-CM | POA: Diagnosis not present

## 2020-10-09 DIAGNOSIS — M1990 Unspecified osteoarthritis, unspecified site: Secondary | ICD-10-CM | POA: Diagnosis not present

## 2020-10-09 DIAGNOSIS — Z79899 Other long term (current) drug therapy: Secondary | ICD-10-CM | POA: Diagnosis not present

## 2020-10-10 DIAGNOSIS — Z79899 Other long term (current) drug therapy: Secondary | ICD-10-CM | POA: Diagnosis not present

## 2020-11-02 DIAGNOSIS — M150 Primary generalized (osteo)arthritis: Secondary | ICD-10-CM | POA: Diagnosis not present

## 2020-11-02 DIAGNOSIS — E785 Hyperlipidemia, unspecified: Secondary | ICD-10-CM | POA: Diagnosis not present

## 2020-11-02 DIAGNOSIS — I10 Essential (primary) hypertension: Secondary | ICD-10-CM | POA: Diagnosis not present

## 2020-11-03 DIAGNOSIS — Z79899 Other long term (current) drug therapy: Secondary | ICD-10-CM | POA: Diagnosis not present

## 2020-11-03 DIAGNOSIS — G894 Chronic pain syndrome: Secondary | ICD-10-CM | POA: Diagnosis not present

## 2020-11-03 DIAGNOSIS — M5136 Other intervertebral disc degeneration, lumbar region: Secondary | ICD-10-CM | POA: Diagnosis not present

## 2020-11-03 DIAGNOSIS — M797 Fibromyalgia: Secondary | ICD-10-CM | POA: Diagnosis not present

## 2020-11-03 DIAGNOSIS — M1990 Unspecified osteoarthritis, unspecified site: Secondary | ICD-10-CM | POA: Diagnosis not present

## 2020-11-08 DIAGNOSIS — Z79899 Other long term (current) drug therapy: Secondary | ICD-10-CM | POA: Diagnosis not present

## 2020-11-09 DIAGNOSIS — Z79899 Other long term (current) drug therapy: Secondary | ICD-10-CM | POA: Diagnosis not present

## 2020-11-09 DIAGNOSIS — M1990 Unspecified osteoarthritis, unspecified site: Secondary | ICD-10-CM | POA: Diagnosis not present

## 2020-11-09 DIAGNOSIS — G894 Chronic pain syndrome: Secondary | ICD-10-CM | POA: Diagnosis not present

## 2020-11-24 DIAGNOSIS — M5136 Other intervertebral disc degeneration, lumbar region: Secondary | ICD-10-CM | POA: Diagnosis not present

## 2020-11-24 DIAGNOSIS — M797 Fibromyalgia: Secondary | ICD-10-CM | POA: Diagnosis not present

## 2020-11-24 DIAGNOSIS — G894 Chronic pain syndrome: Secondary | ICD-10-CM | POA: Diagnosis not present

## 2020-11-24 DIAGNOSIS — Z79899 Other long term (current) drug therapy: Secondary | ICD-10-CM | POA: Diagnosis not present

## 2020-11-24 DIAGNOSIS — Z0000 Encounter for general adult medical examination without abnormal findings: Secondary | ICD-10-CM | POA: Diagnosis not present

## 2020-11-24 DIAGNOSIS — M1990 Unspecified osteoarthritis, unspecified site: Secondary | ICD-10-CM | POA: Diagnosis not present

## 2020-11-28 DIAGNOSIS — Z79899 Other long term (current) drug therapy: Secondary | ICD-10-CM | POA: Diagnosis not present

## 2020-11-30 DIAGNOSIS — S66811D Strain of other specified muscles, fascia and tendons at wrist and hand level, right hand, subsequent encounter: Secondary | ICD-10-CM | POA: Diagnosis not present

## 2020-11-30 DIAGNOSIS — M79641 Pain in right hand: Secondary | ICD-10-CM | POA: Diagnosis not present

## 2020-11-30 DIAGNOSIS — M6281 Muscle weakness (generalized): Secondary | ICD-10-CM | POA: Diagnosis not present

## 2020-11-30 DIAGNOSIS — M25641 Stiffness of right hand, not elsewhere classified: Secondary | ICD-10-CM | POA: Diagnosis not present

## 2020-12-02 DIAGNOSIS — E785 Hyperlipidemia, unspecified: Secondary | ICD-10-CM | POA: Diagnosis not present

## 2020-12-02 DIAGNOSIS — I10 Essential (primary) hypertension: Secondary | ICD-10-CM | POA: Diagnosis not present

## 2020-12-02 DIAGNOSIS — M150 Primary generalized (osteo)arthritis: Secondary | ICD-10-CM | POA: Diagnosis not present

## 2020-12-09 DIAGNOSIS — M1990 Unspecified osteoarthritis, unspecified site: Secondary | ICD-10-CM | POA: Diagnosis not present

## 2020-12-09 DIAGNOSIS — G894 Chronic pain syndrome: Secondary | ICD-10-CM | POA: Diagnosis not present

## 2020-12-09 DIAGNOSIS — Z79899 Other long term (current) drug therapy: Secondary | ICD-10-CM | POA: Diagnosis not present

## 2020-12-21 DIAGNOSIS — M5136 Other intervertebral disc degeneration, lumbar region: Secondary | ICD-10-CM | POA: Diagnosis not present

## 2020-12-21 DIAGNOSIS — Z79899 Other long term (current) drug therapy: Secondary | ICD-10-CM | POA: Diagnosis not present

## 2020-12-21 DIAGNOSIS — M797 Fibromyalgia: Secondary | ICD-10-CM | POA: Diagnosis not present

## 2020-12-21 DIAGNOSIS — G894 Chronic pain syndrome: Secondary | ICD-10-CM | POA: Diagnosis not present

## 2020-12-21 DIAGNOSIS — M1990 Unspecified osteoarthritis, unspecified site: Secondary | ICD-10-CM | POA: Diagnosis not present

## 2020-12-23 DIAGNOSIS — Z79899 Other long term (current) drug therapy: Secondary | ICD-10-CM | POA: Diagnosis not present

## 2020-12-29 DIAGNOSIS — G4700 Insomnia, unspecified: Secondary | ICD-10-CM | POA: Diagnosis not present

## 2021-01-02 DIAGNOSIS — I10 Essential (primary) hypertension: Secondary | ICD-10-CM | POA: Diagnosis not present

## 2021-01-02 DIAGNOSIS — M150 Primary generalized (osteo)arthritis: Secondary | ICD-10-CM | POA: Diagnosis not present

## 2021-01-02 DIAGNOSIS — E785 Hyperlipidemia, unspecified: Secondary | ICD-10-CM | POA: Diagnosis not present

## 2021-01-09 DIAGNOSIS — M1990 Unspecified osteoarthritis, unspecified site: Secondary | ICD-10-CM | POA: Diagnosis not present

## 2021-01-09 DIAGNOSIS — G894 Chronic pain syndrome: Secondary | ICD-10-CM | POA: Diagnosis not present

## 2021-01-09 DIAGNOSIS — Z79899 Other long term (current) drug therapy: Secondary | ICD-10-CM | POA: Diagnosis not present

## 2021-01-19 DIAGNOSIS — Z79899 Other long term (current) drug therapy: Secondary | ICD-10-CM | POA: Diagnosis not present

## 2021-01-19 DIAGNOSIS — M5136 Other intervertebral disc degeneration, lumbar region: Secondary | ICD-10-CM | POA: Diagnosis not present

## 2021-01-19 DIAGNOSIS — M797 Fibromyalgia: Secondary | ICD-10-CM | POA: Diagnosis not present

## 2021-01-19 DIAGNOSIS — M1990 Unspecified osteoarthritis, unspecified site: Secondary | ICD-10-CM | POA: Diagnosis not present

## 2021-01-19 DIAGNOSIS — G894 Chronic pain syndrome: Secondary | ICD-10-CM | POA: Diagnosis not present

## 2021-01-23 DIAGNOSIS — Z79899 Other long term (current) drug therapy: Secondary | ICD-10-CM | POA: Diagnosis not present

## 2021-02-08 DIAGNOSIS — G894 Chronic pain syndrome: Secondary | ICD-10-CM | POA: Diagnosis not present

## 2021-02-08 DIAGNOSIS — Z79899 Other long term (current) drug therapy: Secondary | ICD-10-CM | POA: Diagnosis not present

## 2021-02-08 DIAGNOSIS — M1990 Unspecified osteoarthritis, unspecified site: Secondary | ICD-10-CM | POA: Diagnosis not present

## 2021-02-16 DIAGNOSIS — G894 Chronic pain syndrome: Secondary | ICD-10-CM | POA: Diagnosis not present

## 2021-02-16 DIAGNOSIS — M5136 Other intervertebral disc degeneration, lumbar region: Secondary | ICD-10-CM | POA: Diagnosis not present

## 2021-02-16 DIAGNOSIS — M1990 Unspecified osteoarthritis, unspecified site: Secondary | ICD-10-CM | POA: Diagnosis not present

## 2021-02-16 DIAGNOSIS — Z79899 Other long term (current) drug therapy: Secondary | ICD-10-CM | POA: Diagnosis not present

## 2021-02-16 DIAGNOSIS — M797 Fibromyalgia: Secondary | ICD-10-CM | POA: Diagnosis not present

## 2021-03-03 DIAGNOSIS — M150 Primary generalized (osteo)arthritis: Secondary | ICD-10-CM | POA: Diagnosis not present

## 2021-03-03 DIAGNOSIS — I10 Essential (primary) hypertension: Secondary | ICD-10-CM | POA: Diagnosis not present

## 2021-03-03 DIAGNOSIS — E785 Hyperlipidemia, unspecified: Secondary | ICD-10-CM | POA: Diagnosis not present

## 2021-03-09 DIAGNOSIS — G4700 Insomnia, unspecified: Secondary | ICD-10-CM | POA: Diagnosis not present

## 2021-03-11 DIAGNOSIS — Z79899 Other long term (current) drug therapy: Secondary | ICD-10-CM | POA: Diagnosis not present

## 2021-03-11 DIAGNOSIS — G894 Chronic pain syndrome: Secondary | ICD-10-CM | POA: Diagnosis not present

## 2021-03-11 DIAGNOSIS — M1990 Unspecified osteoarthritis, unspecified site: Secondary | ICD-10-CM | POA: Diagnosis not present

## 2021-03-20 DIAGNOSIS — M1990 Unspecified osteoarthritis, unspecified site: Secondary | ICD-10-CM | POA: Diagnosis not present

## 2021-03-20 DIAGNOSIS — Z131 Encounter for screening for diabetes mellitus: Secondary | ICD-10-CM | POA: Diagnosis not present

## 2021-03-20 DIAGNOSIS — Z79899 Other long term (current) drug therapy: Secondary | ICD-10-CM | POA: Diagnosis not present

## 2021-03-20 DIAGNOSIS — Z0000 Encounter for general adult medical examination without abnormal findings: Secondary | ICD-10-CM | POA: Diagnosis not present

## 2021-03-20 DIAGNOSIS — M797 Fibromyalgia: Secondary | ICD-10-CM | POA: Diagnosis not present

## 2021-03-20 DIAGNOSIS — G894 Chronic pain syndrome: Secondary | ICD-10-CM | POA: Diagnosis not present

## 2021-03-20 DIAGNOSIS — M5136 Other intervertebral disc degeneration, lumbar region: Secondary | ICD-10-CM | POA: Diagnosis not present

## 2021-03-23 DIAGNOSIS — Z79899 Other long term (current) drug therapy: Secondary | ICD-10-CM | POA: Diagnosis not present

## 2021-03-29 DIAGNOSIS — I10 Essential (primary) hypertension: Secondary | ICD-10-CM | POA: Diagnosis not present

## 2021-03-29 DIAGNOSIS — M797 Fibromyalgia: Secondary | ICD-10-CM | POA: Diagnosis not present

## 2021-03-29 DIAGNOSIS — E785 Hyperlipidemia, unspecified: Secondary | ICD-10-CM | POA: Diagnosis not present

## 2021-04-04 DIAGNOSIS — Z9181 History of falling: Secondary | ICD-10-CM | POA: Diagnosis not present

## 2021-04-04 DIAGNOSIS — Z79899 Other long term (current) drug therapy: Secondary | ICD-10-CM | POA: Diagnosis not present

## 2021-04-04 DIAGNOSIS — M1990 Unspecified osteoarthritis, unspecified site: Secondary | ICD-10-CM | POA: Diagnosis not present

## 2021-04-04 DIAGNOSIS — G894 Chronic pain syndrome: Secondary | ICD-10-CM | POA: Diagnosis not present

## 2021-04-04 DIAGNOSIS — M5136 Other intervertebral disc degeneration, lumbar region: Secondary | ICD-10-CM | POA: Diagnosis not present

## 2021-04-04 DIAGNOSIS — M797 Fibromyalgia: Secondary | ICD-10-CM | POA: Diagnosis not present

## 2021-04-06 DIAGNOSIS — Z79899 Other long term (current) drug therapy: Secondary | ICD-10-CM | POA: Diagnosis not present

## 2021-04-11 DIAGNOSIS — R609 Edema, unspecified: Secondary | ICD-10-CM | POA: Diagnosis not present

## 2021-04-11 DIAGNOSIS — R600 Localized edema: Secondary | ICD-10-CM | POA: Diagnosis not present

## 2021-04-11 DIAGNOSIS — Z23 Encounter for immunization: Secondary | ICD-10-CM | POA: Diagnosis not present

## 2021-04-11 DIAGNOSIS — Z79899 Other long term (current) drug therapy: Secondary | ICD-10-CM | POA: Diagnosis not present

## 2021-04-11 DIAGNOSIS — I10 Essential (primary) hypertension: Secondary | ICD-10-CM | POA: Diagnosis not present

## 2021-04-11 DIAGNOSIS — E7800 Pure hypercholesterolemia, unspecified: Secondary | ICD-10-CM | POA: Diagnosis not present

## 2021-04-11 DIAGNOSIS — M797 Fibromyalgia: Secondary | ICD-10-CM | POA: Diagnosis not present

## 2021-04-11 DIAGNOSIS — M1990 Unspecified osteoarthritis, unspecified site: Secondary | ICD-10-CM | POA: Diagnosis not present

## 2021-04-11 DIAGNOSIS — G894 Chronic pain syndrome: Secondary | ICD-10-CM | POA: Diagnosis not present

## 2021-04-27 DIAGNOSIS — Z9181 History of falling: Secondary | ICD-10-CM | POA: Diagnosis not present

## 2021-04-27 DIAGNOSIS — G894 Chronic pain syndrome: Secondary | ICD-10-CM | POA: Diagnosis not present

## 2021-04-27 DIAGNOSIS — M1990 Unspecified osteoarthritis, unspecified site: Secondary | ICD-10-CM | POA: Diagnosis not present

## 2021-04-27 DIAGNOSIS — Z79899 Other long term (current) drug therapy: Secondary | ICD-10-CM | POA: Diagnosis not present

## 2021-04-27 DIAGNOSIS — M797 Fibromyalgia: Secondary | ICD-10-CM | POA: Diagnosis not present

## 2021-04-27 DIAGNOSIS — M5136 Other intervertebral disc degeneration, lumbar region: Secondary | ICD-10-CM | POA: Diagnosis not present

## 2021-05-01 DIAGNOSIS — Z79899 Other long term (current) drug therapy: Secondary | ICD-10-CM | POA: Diagnosis not present

## 2021-05-09 DIAGNOSIS — G894 Chronic pain syndrome: Secondary | ICD-10-CM | POA: Diagnosis not present

## 2021-05-09 DIAGNOSIS — M1990 Unspecified osteoarthritis, unspecified site: Secondary | ICD-10-CM | POA: Diagnosis not present

## 2021-05-09 DIAGNOSIS — Z79899 Other long term (current) drug therapy: Secondary | ICD-10-CM | POA: Diagnosis not present

## 2021-05-10 DIAGNOSIS — R7989 Other specified abnormal findings of blood chemistry: Secondary | ICD-10-CM | POA: Diagnosis not present

## 2021-05-17 DIAGNOSIS — S66811A Strain of other specified muscles, fascia and tendons at wrist and hand level, right hand, initial encounter: Secondary | ICD-10-CM | POA: Diagnosis not present

## 2021-05-24 DIAGNOSIS — R1900 Intra-abdominal and pelvic swelling, mass and lump, unspecified site: Secondary | ICD-10-CM | POA: Diagnosis not present

## 2021-05-24 DIAGNOSIS — R9389 Abnormal findings on diagnostic imaging of other specified body structures: Secondary | ICD-10-CM | POA: Diagnosis not present

## 2021-06-02 DIAGNOSIS — E785 Hyperlipidemia, unspecified: Secondary | ICD-10-CM | POA: Diagnosis not present

## 2021-06-02 DIAGNOSIS — Z9181 History of falling: Secondary | ICD-10-CM | POA: Diagnosis not present

## 2021-06-02 DIAGNOSIS — G894 Chronic pain syndrome: Secondary | ICD-10-CM | POA: Diagnosis not present

## 2021-06-02 DIAGNOSIS — I10 Essential (primary) hypertension: Secondary | ICD-10-CM | POA: Diagnosis not present

## 2021-06-02 DIAGNOSIS — Z79899 Other long term (current) drug therapy: Secondary | ICD-10-CM | POA: Diagnosis not present

## 2021-06-02 DIAGNOSIS — M5136 Other intervertebral disc degeneration, lumbar region: Secondary | ICD-10-CM | POA: Diagnosis not present

## 2021-06-02 DIAGNOSIS — M797 Fibromyalgia: Secondary | ICD-10-CM | POA: Diagnosis not present

## 2021-06-02 DIAGNOSIS — M1990 Unspecified osteoarthritis, unspecified site: Secondary | ICD-10-CM | POA: Diagnosis not present

## 2021-06-06 DIAGNOSIS — Z79899 Other long term (current) drug therapy: Secondary | ICD-10-CM | POA: Diagnosis not present

## 2021-06-08 DIAGNOSIS — R1900 Intra-abdominal and pelvic swelling, mass and lump, unspecified site: Secondary | ICD-10-CM | POA: Diagnosis not present

## 2021-06-09 DIAGNOSIS — M1990 Unspecified osteoarthritis, unspecified site: Secondary | ICD-10-CM | POA: Diagnosis not present

## 2021-06-09 DIAGNOSIS — Z79899 Other long term (current) drug therapy: Secondary | ICD-10-CM | POA: Diagnosis not present

## 2021-06-09 DIAGNOSIS — M5136 Other intervertebral disc degeneration, lumbar region: Secondary | ICD-10-CM | POA: Diagnosis not present

## 2021-06-09 DIAGNOSIS — G894 Chronic pain syndrome: Secondary | ICD-10-CM | POA: Diagnosis not present

## 2021-06-22 DIAGNOSIS — M797 Fibromyalgia: Secondary | ICD-10-CM | POA: Diagnosis not present

## 2021-06-22 DIAGNOSIS — Z885 Allergy status to narcotic agent status: Secondary | ICD-10-CM | POA: Diagnosis not present

## 2021-06-22 DIAGNOSIS — R1900 Intra-abdominal and pelvic swelling, mass and lump, unspecified site: Secondary | ICD-10-CM | POA: Diagnosis not present

## 2021-06-22 DIAGNOSIS — Z79899 Other long term (current) drug therapy: Secondary | ICD-10-CM | POA: Diagnosis not present

## 2021-06-22 DIAGNOSIS — R9389 Abnormal findings on diagnostic imaging of other specified body structures: Secondary | ICD-10-CM | POA: Diagnosis not present

## 2021-06-22 DIAGNOSIS — Z880 Allergy status to penicillin: Secondary | ICD-10-CM | POA: Diagnosis not present

## 2021-07-04 DIAGNOSIS — M5136 Other intervertebral disc degeneration, lumbar region: Secondary | ICD-10-CM | POA: Diagnosis not present

## 2021-07-04 DIAGNOSIS — Z9181 History of falling: Secondary | ICD-10-CM | POA: Diagnosis not present

## 2021-07-04 DIAGNOSIS — M797 Fibromyalgia: Secondary | ICD-10-CM | POA: Diagnosis not present

## 2021-07-04 DIAGNOSIS — G894 Chronic pain syndrome: Secondary | ICD-10-CM | POA: Diagnosis not present

## 2021-07-04 DIAGNOSIS — Z79899 Other long term (current) drug therapy: Secondary | ICD-10-CM | POA: Diagnosis not present

## 2021-07-04 DIAGNOSIS — M1990 Unspecified osteoarthritis, unspecified site: Secondary | ICD-10-CM | POA: Diagnosis not present

## 2021-07-05 DIAGNOSIS — M797 Fibromyalgia: Secondary | ICD-10-CM | POA: Diagnosis not present

## 2021-07-05 DIAGNOSIS — I10 Essential (primary) hypertension: Secondary | ICD-10-CM | POA: Diagnosis not present

## 2021-07-05 DIAGNOSIS — R1900 Intra-abdominal and pelvic swelling, mass and lump, unspecified site: Secondary | ICD-10-CM | POA: Diagnosis not present

## 2021-07-07 DIAGNOSIS — Z79899 Other long term (current) drug therapy: Secondary | ICD-10-CM | POA: Diagnosis not present

## 2021-08-01 DIAGNOSIS — Z79899 Other long term (current) drug therapy: Secondary | ICD-10-CM | POA: Diagnosis not present

## 2021-08-01 DIAGNOSIS — G894 Chronic pain syndrome: Secondary | ICD-10-CM | POA: Diagnosis not present

## 2021-08-01 DIAGNOSIS — M797 Fibromyalgia: Secondary | ICD-10-CM | POA: Diagnosis not present

## 2021-08-01 DIAGNOSIS — M1990 Unspecified osteoarthritis, unspecified site: Secondary | ICD-10-CM | POA: Diagnosis not present

## 2021-08-01 DIAGNOSIS — M5136 Other intervertebral disc degeneration, lumbar region: Secondary | ICD-10-CM | POA: Diagnosis not present

## 2021-08-25 DIAGNOSIS — Z131 Encounter for screening for diabetes mellitus: Secondary | ICD-10-CM | POA: Diagnosis not present

## 2021-08-25 DIAGNOSIS — E559 Vitamin D deficiency, unspecified: Secondary | ICD-10-CM | POA: Diagnosis not present

## 2021-08-25 DIAGNOSIS — M797 Fibromyalgia: Secondary | ICD-10-CM | POA: Diagnosis not present

## 2021-08-25 DIAGNOSIS — M1990 Unspecified osteoarthritis, unspecified site: Secondary | ICD-10-CM | POA: Diagnosis not present

## 2021-08-25 DIAGNOSIS — G894 Chronic pain syndrome: Secondary | ICD-10-CM | POA: Diagnosis not present

## 2021-08-25 DIAGNOSIS — Z9181 History of falling: Secondary | ICD-10-CM | POA: Diagnosis not present

## 2021-08-25 DIAGNOSIS — M129 Arthropathy, unspecified: Secondary | ICD-10-CM | POA: Diagnosis not present

## 2021-08-25 DIAGNOSIS — Z1159 Encounter for screening for other viral diseases: Secondary | ICD-10-CM | POA: Diagnosis not present

## 2021-08-25 DIAGNOSIS — Z79899 Other long term (current) drug therapy: Secondary | ICD-10-CM | POA: Diagnosis not present

## 2021-08-29 DIAGNOSIS — Z79899 Other long term (current) drug therapy: Secondary | ICD-10-CM | POA: Diagnosis not present

## 2021-09-25 DIAGNOSIS — M797 Fibromyalgia: Secondary | ICD-10-CM | POA: Diagnosis not present

## 2021-09-25 DIAGNOSIS — M1990 Unspecified osteoarthritis, unspecified site: Secondary | ICD-10-CM | POA: Diagnosis not present

## 2021-09-25 DIAGNOSIS — G4709 Other insomnia: Secondary | ICD-10-CM | POA: Diagnosis not present

## 2021-09-25 DIAGNOSIS — R1900 Intra-abdominal and pelvic swelling, mass and lump, unspecified site: Secondary | ICD-10-CM | POA: Diagnosis not present

## 2021-09-25 DIAGNOSIS — Z9181 History of falling: Secondary | ICD-10-CM | POA: Diagnosis not present

## 2021-09-25 DIAGNOSIS — R110 Nausea: Secondary | ICD-10-CM | POA: Diagnosis not present

## 2021-09-25 DIAGNOSIS — E559 Vitamin D deficiency, unspecified: Secondary | ICD-10-CM | POA: Diagnosis not present

## 2021-09-25 DIAGNOSIS — M5136 Other intervertebral disc degeneration, lumbar region: Secondary | ICD-10-CM | POA: Diagnosis not present

## 2021-09-25 DIAGNOSIS — G894 Chronic pain syndrome: Secondary | ICD-10-CM | POA: Diagnosis not present

## 2021-09-25 DIAGNOSIS — Z79899 Other long term (current) drug therapy: Secondary | ICD-10-CM | POA: Diagnosis not present

## 2021-09-27 DIAGNOSIS — Z79899 Other long term (current) drug therapy: Secondary | ICD-10-CM | POA: Diagnosis not present

## 2021-10-25 DIAGNOSIS — R600 Localized edema: Secondary | ICD-10-CM | POA: Diagnosis not present

## 2021-10-25 DIAGNOSIS — Z79899 Other long term (current) drug therapy: Secondary | ICD-10-CM | POA: Diagnosis not present

## 2021-10-25 DIAGNOSIS — G894 Chronic pain syndrome: Secondary | ICD-10-CM | POA: Diagnosis not present

## 2021-10-25 DIAGNOSIS — M797 Fibromyalgia: Secondary | ICD-10-CM | POA: Diagnosis not present

## 2021-10-25 DIAGNOSIS — M1990 Unspecified osteoarthritis, unspecified site: Secondary | ICD-10-CM | POA: Diagnosis not present

## 2021-10-25 DIAGNOSIS — R1900 Intra-abdominal and pelvic swelling, mass and lump, unspecified site: Secondary | ICD-10-CM | POA: Diagnosis not present

## 2021-10-25 DIAGNOSIS — G4709 Other insomnia: Secondary | ICD-10-CM | POA: Diagnosis not present

## 2021-10-25 DIAGNOSIS — R110 Nausea: Secondary | ICD-10-CM | POA: Diagnosis not present

## 2021-10-25 DIAGNOSIS — Z9181 History of falling: Secondary | ICD-10-CM | POA: Diagnosis not present

## 2021-10-25 DIAGNOSIS — E559 Vitamin D deficiency, unspecified: Secondary | ICD-10-CM | POA: Diagnosis not present

## 2021-10-25 DIAGNOSIS — M5136 Other intervertebral disc degeneration, lumbar region: Secondary | ICD-10-CM | POA: Diagnosis not present

## 2021-10-27 DIAGNOSIS — Z79899 Other long term (current) drug therapy: Secondary | ICD-10-CM | POA: Diagnosis not present

## 2021-11-02 DIAGNOSIS — R1900 Intra-abdominal and pelvic swelling, mass and lump, unspecified site: Secondary | ICD-10-CM | POA: Diagnosis not present

## 2021-11-13 DIAGNOSIS — M7989 Other specified soft tissue disorders: Secondary | ICD-10-CM | POA: Diagnosis not present

## 2021-11-13 DIAGNOSIS — M79669 Pain in unspecified lower leg: Secondary | ICD-10-CM | POA: Diagnosis not present

## 2021-11-13 DIAGNOSIS — R1900 Intra-abdominal and pelvic swelling, mass and lump, unspecified site: Secondary | ICD-10-CM | POA: Diagnosis not present

## 2021-11-13 DIAGNOSIS — M1990 Unspecified osteoarthritis, unspecified site: Secondary | ICD-10-CM | POA: Diagnosis not present

## 2021-11-13 DIAGNOSIS — I10 Essential (primary) hypertension: Secondary | ICD-10-CM | POA: Diagnosis not present

## 2021-11-13 DIAGNOSIS — Z1231 Encounter for screening mammogram for malignant neoplasm of breast: Secondary | ICD-10-CM | POA: Diagnosis not present

## 2021-11-13 DIAGNOSIS — M797 Fibromyalgia: Secondary | ICD-10-CM | POA: Diagnosis not present

## 2021-11-13 DIAGNOSIS — R110 Nausea: Secondary | ICD-10-CM | POA: Diagnosis not present

## 2021-11-23 DIAGNOSIS — Z79899 Other long term (current) drug therapy: Secondary | ICD-10-CM | POA: Diagnosis not present

## 2021-11-23 DIAGNOSIS — M5136 Other intervertebral disc degeneration, lumbar region: Secondary | ICD-10-CM | POA: Diagnosis not present

## 2021-11-23 DIAGNOSIS — Z9181 History of falling: Secondary | ICD-10-CM | POA: Diagnosis not present

## 2021-11-23 DIAGNOSIS — Z23 Encounter for immunization: Secondary | ICD-10-CM | POA: Diagnosis not present

## 2021-11-23 DIAGNOSIS — E559 Vitamin D deficiency, unspecified: Secondary | ICD-10-CM | POA: Diagnosis not present

## 2021-11-23 DIAGNOSIS — G894 Chronic pain syndrome: Secondary | ICD-10-CM | POA: Diagnosis not present

## 2021-11-23 DIAGNOSIS — R110 Nausea: Secondary | ICD-10-CM | POA: Diagnosis not present

## 2021-11-23 DIAGNOSIS — R1900 Intra-abdominal and pelvic swelling, mass and lump, unspecified site: Secondary | ICD-10-CM | POA: Diagnosis not present

## 2021-11-23 DIAGNOSIS — G4709 Other insomnia: Secondary | ICD-10-CM | POA: Diagnosis not present

## 2021-11-23 DIAGNOSIS — M1990 Unspecified osteoarthritis, unspecified site: Secondary | ICD-10-CM | POA: Diagnosis not present

## 2021-11-23 DIAGNOSIS — M797 Fibromyalgia: Secondary | ICD-10-CM | POA: Diagnosis not present

## 2021-11-27 DIAGNOSIS — Z79899 Other long term (current) drug therapy: Secondary | ICD-10-CM | POA: Diagnosis not present

## 2021-12-21 DIAGNOSIS — M1990 Unspecified osteoarthritis, unspecified site: Secondary | ICD-10-CM | POA: Diagnosis not present

## 2021-12-21 DIAGNOSIS — G894 Chronic pain syndrome: Secondary | ICD-10-CM | POA: Diagnosis not present

## 2021-12-21 DIAGNOSIS — M545 Low back pain, unspecified: Secondary | ICD-10-CM | POA: Diagnosis not present

## 2021-12-21 DIAGNOSIS — G8929 Other chronic pain: Secondary | ICD-10-CM | POA: Diagnosis not present

## 2021-12-21 DIAGNOSIS — M797 Fibromyalgia: Secondary | ICD-10-CM | POA: Diagnosis not present

## 2021-12-21 DIAGNOSIS — M5136 Other intervertebral disc degeneration, lumbar region: Secondary | ICD-10-CM | POA: Diagnosis not present

## 2021-12-21 DIAGNOSIS — Z79899 Other long term (current) drug therapy: Secondary | ICD-10-CM | POA: Diagnosis not present

## 2022-01-17 DIAGNOSIS — Z79899 Other long term (current) drug therapy: Secondary | ICD-10-CM | POA: Diagnosis not present

## 2022-01-17 DIAGNOSIS — M797 Fibromyalgia: Secondary | ICD-10-CM | POA: Diagnosis not present

## 2022-01-17 DIAGNOSIS — G4709 Other insomnia: Secondary | ICD-10-CM | POA: Diagnosis not present

## 2022-01-17 DIAGNOSIS — G894 Chronic pain syndrome: Secondary | ICD-10-CM | POA: Diagnosis not present

## 2022-01-17 DIAGNOSIS — R110 Nausea: Secondary | ICD-10-CM | POA: Diagnosis not present

## 2022-01-17 DIAGNOSIS — Z9181 History of falling: Secondary | ICD-10-CM | POA: Diagnosis not present

## 2022-01-17 DIAGNOSIS — R1900 Intra-abdominal and pelvic swelling, mass and lump, unspecified site: Secondary | ICD-10-CM | POA: Diagnosis not present

## 2022-01-17 DIAGNOSIS — M1990 Unspecified osteoarthritis, unspecified site: Secondary | ICD-10-CM | POA: Diagnosis not present

## 2022-01-17 DIAGNOSIS — E559 Vitamin D deficiency, unspecified: Secondary | ICD-10-CM | POA: Diagnosis not present

## 2022-01-17 DIAGNOSIS — Z131 Encounter for screening for diabetes mellitus: Secondary | ICD-10-CM | POA: Diagnosis not present

## 2022-01-17 DIAGNOSIS — M5136 Other intervertebral disc degeneration, lumbar region: Secondary | ICD-10-CM | POA: Diagnosis not present

## 2022-01-19 DIAGNOSIS — Z79899 Other long term (current) drug therapy: Secondary | ICD-10-CM | POA: Diagnosis not present

## 2022-02-04 IMAGING — MR MR [PERSON_NAME]*[PERSON_NAME]* W/O CM
5 series · 40 of 40 positions shown · non-contrast
Comparison: None.

CLINICAL DATA: Right hand swelling, fall 2 months ago.

EXAM:
MRI OF THE RIGHT HAND WITHOUT CONTRAST
TECHNIQUE: Multiplanar, multisequence MR imaging of the right hand was
performed. No intravenous contrast was administered.

[Series 4: T1 · axial · right · 4.0mm · 0.38mm/px · z∈[-75,+128]mm · 11 of 45 slices shown (1 of 2)]
[im 1/45]
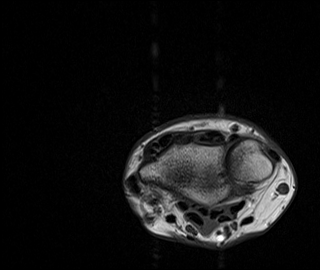
[im 5/45]
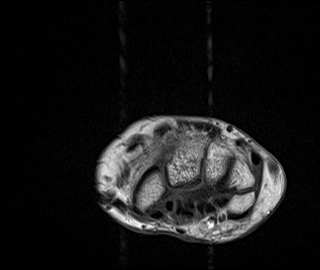
[im 9/45]
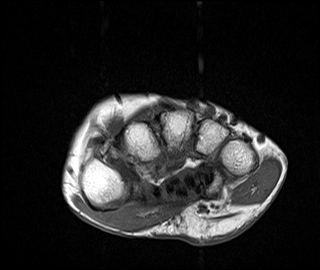
[im 14/45]
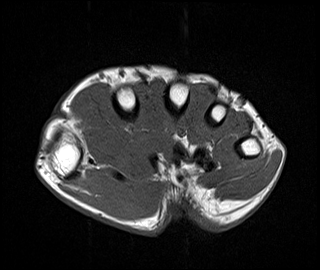
[im 18/45]
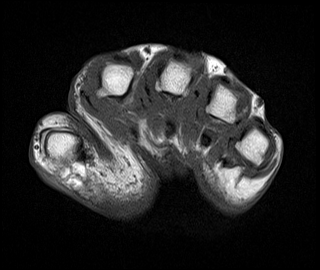
[im 23/45]
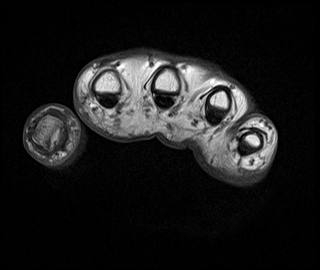
[im 27/45]
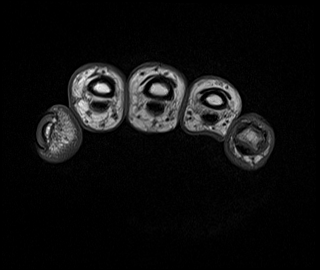
[im 31/45]
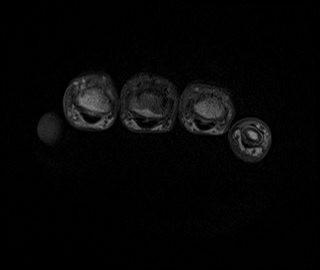
[im 36/45]
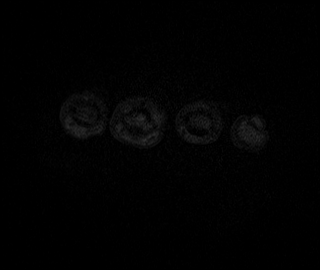
[im 40/45]
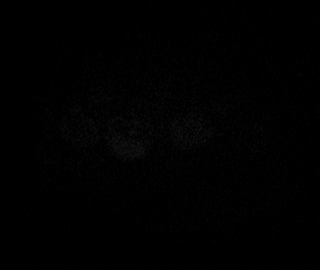
[im 45/45]
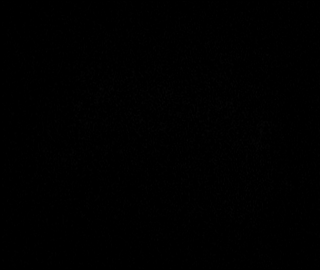

[Series 6: T1 · coronal · right · 3.0mm · 0.49mm/px · 6 of 28 slices shown (2 of 2)]
[im 1/28]
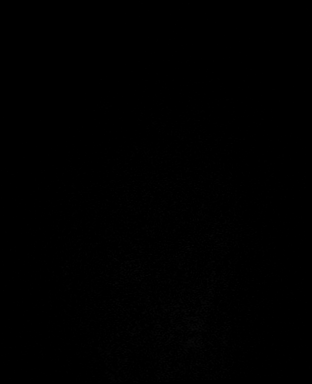
[im 6/28]
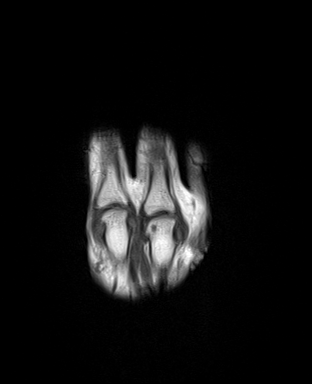
[im 11/28]
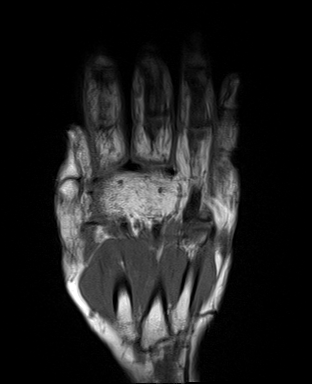
[im 17/28]
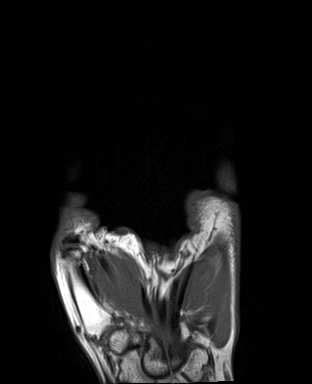
[im 22/28]
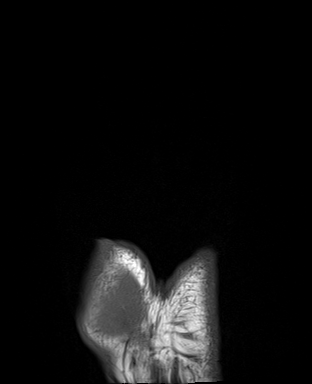
[im 28/28]
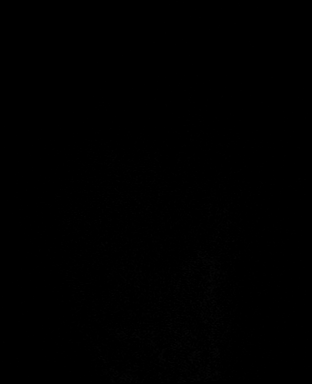

[Series 8: PD fat-sat · sagittal · right · 3.0mm · 0.62mm/px · 7 of 31 slices shown]
[im 1/31]
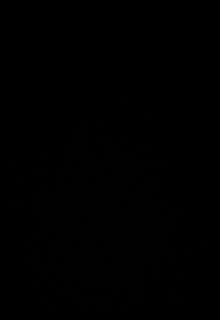
[im 6/31]
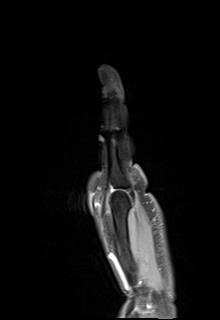
[im 11/31]
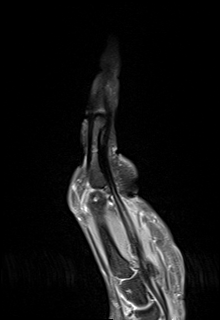
[im 16/31]
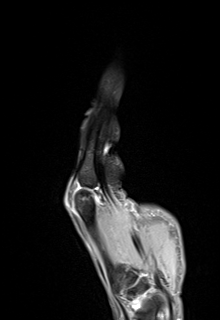
[im 21/31]
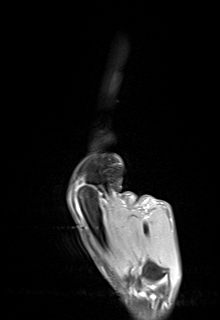
[im 26/31]
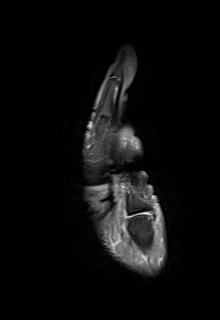
[im 31/31]
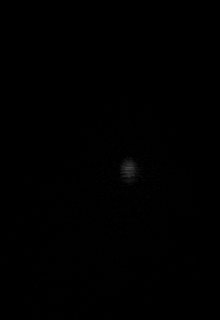

[Series 9: STIR · coronal · right · 3.0mm · 0.74mm/px · 6 of 27 slices shown]
[im 1/27]
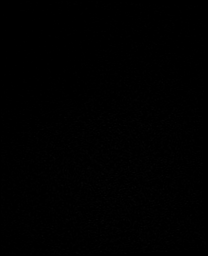
[im 6/27]
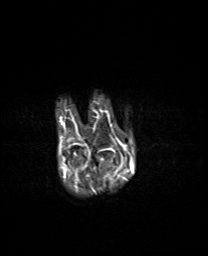
[im 11/27]
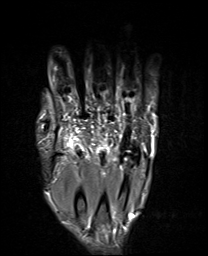
[im 16/27]
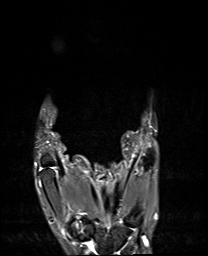
[im 21/27]
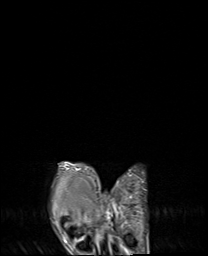
[im 27/27]
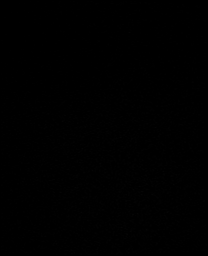

[Series 10: T2 fat-sat · axial · right · 4.0mm · 0.47mm/px · z∈[-75,+128]mm · 10 of 42 slices shown]
[im 1/42]
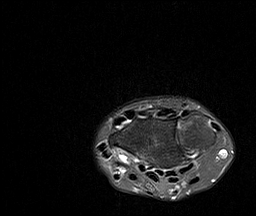
[im 5/42]
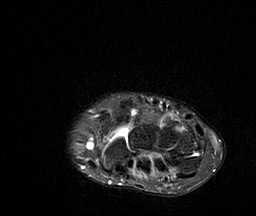
[im 10/42]
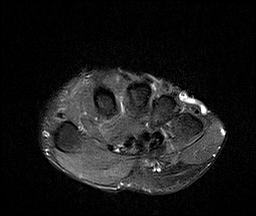
[im 14/42]
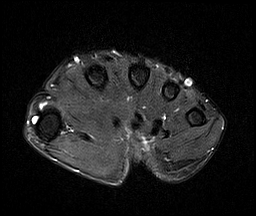
[im 19/42]
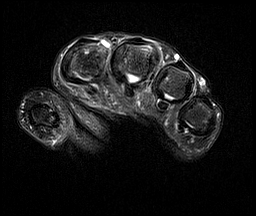
[im 23/42]
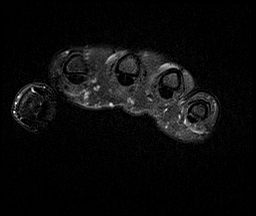
[im 28/42]
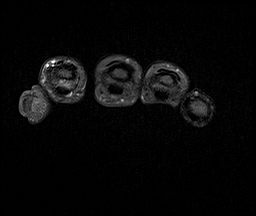
[im 32/42]
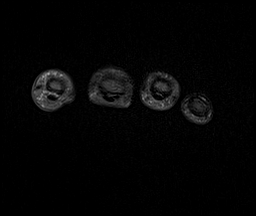
[im 37/42]
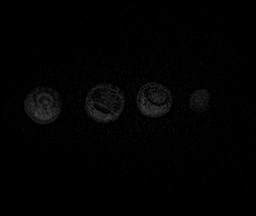
[im 42/42]
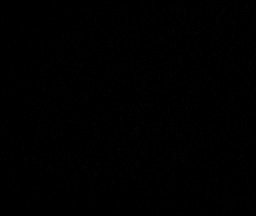

[40 of 40 positions shown; findings below may reference images not displayed]

FINDINGS: Bones/Joint/Cartilage

No acute fracture. No dislocation. Moderate arthropathy of the
second, third, and fourth MCP joints as well as the first CMC joint.
Mild subchondral marrow edema at the second-fourth MCP joints.

Ligaments

There is a defect in the ulnar sagittal band of the second MCP joint
with fluid extending through defect (series 10, image 28). There is
high-grade partial-thickness or possibly a full-thickness tear of
the ulnar sagittal band of the third MCP joint (series 10, image
28). Thickening and heterogeneity of the ulnar collateral ligament
of the third MCP joint without evidence of tear (series 9, image
21). High-grade sprain or partial thickness tear of the ulnar
collateral ligament of the fourth MCP joint (series 9, images
20-21). The remaining ligamentous structures appear intact.

Muscles and Tendons

Preserved muscle bulk and signal intensity. Intact flexor and
extensor tendons.

Soft tissues

Soft tissue edema at the dorsum of the right hand at the level of
the MCP joints. No fluid collection or hematoma.
IMPRESSION: 1. Complete ulnar sagittal band tear of the second MCP joint.
2. High-grade partial-thickness or possibly full-thickness tear of
the ulnar sagittal band of the third MCP joint.
3. High-grade sprain of the ulnar collateral ligaments of the third
and fourth MCP joints.
4. Mild-to-moderate osteoarthritis of the right hand.

## 2022-02-13 DIAGNOSIS — G4709 Other insomnia: Secondary | ICD-10-CM | POA: Diagnosis not present

## 2022-02-13 DIAGNOSIS — G894 Chronic pain syndrome: Secondary | ICD-10-CM | POA: Diagnosis not present

## 2022-02-13 DIAGNOSIS — Z9181 History of falling: Secondary | ICD-10-CM | POA: Diagnosis not present

## 2022-02-13 DIAGNOSIS — M1990 Unspecified osteoarthritis, unspecified site: Secondary | ICD-10-CM | POA: Diagnosis not present

## 2022-02-13 DIAGNOSIS — M797 Fibromyalgia: Secondary | ICD-10-CM | POA: Diagnosis not present

## 2022-02-13 DIAGNOSIS — R110 Nausea: Secondary | ICD-10-CM | POA: Diagnosis not present

## 2022-02-13 DIAGNOSIS — Z79899 Other long term (current) drug therapy: Secondary | ICD-10-CM | POA: Diagnosis not present

## 2022-02-13 DIAGNOSIS — R1900 Intra-abdominal and pelvic swelling, mass and lump, unspecified site: Secondary | ICD-10-CM | POA: Diagnosis not present

## 2022-02-13 DIAGNOSIS — E559 Vitamin D deficiency, unspecified: Secondary | ICD-10-CM | POA: Diagnosis not present

## 2022-02-15 DIAGNOSIS — Z79899 Other long term (current) drug therapy: Secondary | ICD-10-CM | POA: Diagnosis not present

## 2022-03-06 DIAGNOSIS — Z79899 Other long term (current) drug therapy: Secondary | ICD-10-CM | POA: Diagnosis not present

## 2022-03-06 DIAGNOSIS — Z9181 History of falling: Secondary | ICD-10-CM | POA: Diagnosis not present

## 2022-03-06 DIAGNOSIS — M549 Dorsalgia, unspecified: Secondary | ICD-10-CM | POA: Diagnosis not present

## 2022-03-06 DIAGNOSIS — R1900 Intra-abdominal and pelvic swelling, mass and lump, unspecified site: Secondary | ICD-10-CM | POA: Diagnosis not present

## 2022-03-06 DIAGNOSIS — R110 Nausea: Secondary | ICD-10-CM | POA: Diagnosis not present

## 2022-03-06 DIAGNOSIS — G4709 Other insomnia: Secondary | ICD-10-CM | POA: Diagnosis not present

## 2022-03-06 DIAGNOSIS — M797 Fibromyalgia: Secondary | ICD-10-CM | POA: Diagnosis not present

## 2022-03-06 DIAGNOSIS — Z0000 Encounter for general adult medical examination without abnormal findings: Secondary | ICD-10-CM | POA: Diagnosis not present

## 2022-03-06 DIAGNOSIS — M1990 Unspecified osteoarthritis, unspecified site: Secondary | ICD-10-CM | POA: Diagnosis not present

## 2022-03-06 DIAGNOSIS — E559 Vitamin D deficiency, unspecified: Secondary | ICD-10-CM | POA: Diagnosis not present

## 2022-03-06 DIAGNOSIS — G894 Chronic pain syndrome: Secondary | ICD-10-CM | POA: Diagnosis not present

## 2022-03-08 DIAGNOSIS — Z79899 Other long term (current) drug therapy: Secondary | ICD-10-CM | POA: Diagnosis not present

## 2022-04-05 DIAGNOSIS — R110 Nausea: Secondary | ICD-10-CM | POA: Diagnosis not present

## 2022-04-05 DIAGNOSIS — R1900 Intra-abdominal and pelvic swelling, mass and lump, unspecified site: Secondary | ICD-10-CM | POA: Diagnosis not present

## 2022-04-05 DIAGNOSIS — M797 Fibromyalgia: Secondary | ICD-10-CM | POA: Diagnosis not present

## 2022-04-05 DIAGNOSIS — Z9181 History of falling: Secondary | ICD-10-CM | POA: Diagnosis not present

## 2022-04-05 DIAGNOSIS — M1990 Unspecified osteoarthritis, unspecified site: Secondary | ICD-10-CM | POA: Diagnosis not present

## 2022-04-05 DIAGNOSIS — E559 Vitamin D deficiency, unspecified: Secondary | ICD-10-CM | POA: Diagnosis not present

## 2022-04-05 DIAGNOSIS — Z79899 Other long term (current) drug therapy: Secondary | ICD-10-CM | POA: Diagnosis not present

## 2022-04-05 DIAGNOSIS — G894 Chronic pain syndrome: Secondary | ICD-10-CM | POA: Diagnosis not present

## 2022-04-05 DIAGNOSIS — G4709 Other insomnia: Secondary | ICD-10-CM | POA: Diagnosis not present

## 2022-04-05 DIAGNOSIS — M549 Dorsalgia, unspecified: Secondary | ICD-10-CM | POA: Diagnosis not present

## 2022-04-09 DIAGNOSIS — Z79899 Other long term (current) drug therapy: Secondary | ICD-10-CM | POA: Diagnosis not present

## 2022-04-11 DIAGNOSIS — G4700 Insomnia, unspecified: Secondary | ICD-10-CM | POA: Diagnosis not present

## 2022-04-11 DIAGNOSIS — I10 Essential (primary) hypertension: Secondary | ICD-10-CM | POA: Diagnosis not present

## 2022-04-27 DIAGNOSIS — R600 Localized edema: Secondary | ICD-10-CM | POA: Diagnosis not present

## 2022-04-27 DIAGNOSIS — E785 Hyperlipidemia, unspecified: Secondary | ICD-10-CM | POA: Diagnosis not present

## 2022-04-27 DIAGNOSIS — I10 Essential (primary) hypertension: Secondary | ICD-10-CM | POA: Diagnosis not present

## 2022-04-27 DIAGNOSIS — I7090 Unspecified atherosclerosis: Secondary | ICD-10-CM | POA: Diagnosis not present

## 2022-05-02 DIAGNOSIS — Z79899 Other long term (current) drug therapy: Secondary | ICD-10-CM | POA: Diagnosis not present

## 2022-05-02 DIAGNOSIS — M5136 Other intervertebral disc degeneration, lumbar region: Secondary | ICD-10-CM | POA: Diagnosis not present

## 2022-05-02 DIAGNOSIS — G8929 Other chronic pain: Secondary | ICD-10-CM | POA: Diagnosis not present

## 2022-05-02 DIAGNOSIS — G894 Chronic pain syndrome: Secondary | ICD-10-CM | POA: Diagnosis not present

## 2022-05-02 DIAGNOSIS — M1990 Unspecified osteoarthritis, unspecified site: Secondary | ICD-10-CM | POA: Diagnosis not present

## 2022-05-02 DIAGNOSIS — M545 Low back pain, unspecified: Secondary | ICD-10-CM | POA: Diagnosis not present

## 2022-05-02 DIAGNOSIS — M797 Fibromyalgia: Secondary | ICD-10-CM | POA: Diagnosis not present

## 2022-05-29 DIAGNOSIS — G894 Chronic pain syndrome: Secondary | ICD-10-CM | POA: Diagnosis not present

## 2022-05-29 DIAGNOSIS — M797 Fibromyalgia: Secondary | ICD-10-CM | POA: Diagnosis not present

## 2022-05-29 DIAGNOSIS — E559 Vitamin D deficiency, unspecified: Secondary | ICD-10-CM | POA: Diagnosis not present

## 2022-05-29 DIAGNOSIS — R110 Nausea: Secondary | ICD-10-CM | POA: Diagnosis not present

## 2022-05-29 DIAGNOSIS — Z79899 Other long term (current) drug therapy: Secondary | ICD-10-CM | POA: Diagnosis not present

## 2022-05-29 DIAGNOSIS — R1900 Intra-abdominal and pelvic swelling, mass and lump, unspecified site: Secondary | ICD-10-CM | POA: Diagnosis not present

## 2022-05-29 DIAGNOSIS — G4709 Other insomnia: Secondary | ICD-10-CM | POA: Diagnosis not present

## 2022-05-29 DIAGNOSIS — M1990 Unspecified osteoarthritis, unspecified site: Secondary | ICD-10-CM | POA: Diagnosis not present

## 2022-05-29 DIAGNOSIS — Z9181 History of falling: Secondary | ICD-10-CM | POA: Diagnosis not present

## 2022-05-29 DIAGNOSIS — M549 Dorsalgia, unspecified: Secondary | ICD-10-CM | POA: Diagnosis not present

## 2022-05-31 DIAGNOSIS — Z79899 Other long term (current) drug therapy: Secondary | ICD-10-CM | POA: Diagnosis not present

## 2022-06-26 DIAGNOSIS — Z79899 Other long term (current) drug therapy: Secondary | ICD-10-CM | POA: Diagnosis not present

## 2022-06-26 DIAGNOSIS — M797 Fibromyalgia: Secondary | ICD-10-CM | POA: Diagnosis not present

## 2022-06-26 DIAGNOSIS — Z9181 History of falling: Secondary | ICD-10-CM | POA: Diagnosis not present

## 2022-06-26 DIAGNOSIS — R1900 Intra-abdominal and pelvic swelling, mass and lump, unspecified site: Secondary | ICD-10-CM | POA: Diagnosis not present

## 2022-06-26 DIAGNOSIS — G4709 Other insomnia: Secondary | ICD-10-CM | POA: Diagnosis not present

## 2022-06-26 DIAGNOSIS — R110 Nausea: Secondary | ICD-10-CM | POA: Diagnosis not present

## 2022-06-26 DIAGNOSIS — M1990 Unspecified osteoarthritis, unspecified site: Secondary | ICD-10-CM | POA: Diagnosis not present

## 2022-06-26 DIAGNOSIS — M549 Dorsalgia, unspecified: Secondary | ICD-10-CM | POA: Diagnosis not present

## 2022-06-26 DIAGNOSIS — E559 Vitamin D deficiency, unspecified: Secondary | ICD-10-CM | POA: Diagnosis not present

## 2022-06-28 DIAGNOSIS — Z79899 Other long term (current) drug therapy: Secondary | ICD-10-CM | POA: Diagnosis not present

## 2022-07-03 DIAGNOSIS — R110 Nausea: Secondary | ICD-10-CM | POA: Diagnosis not present

## 2022-07-03 DIAGNOSIS — Z1211 Encounter for screening for malignant neoplasm of colon: Secondary | ICD-10-CM | POA: Diagnosis not present

## 2022-07-30 DIAGNOSIS — Z79899 Other long term (current) drug therapy: Secondary | ICD-10-CM | POA: Diagnosis not present

## 2022-07-30 DIAGNOSIS — Z9181 History of falling: Secondary | ICD-10-CM | POA: Diagnosis not present

## 2022-07-30 DIAGNOSIS — E559 Vitamin D deficiency, unspecified: Secondary | ICD-10-CM | POA: Diagnosis not present

## 2022-07-30 DIAGNOSIS — M797 Fibromyalgia: Secondary | ICD-10-CM | POA: Diagnosis not present

## 2022-07-30 DIAGNOSIS — M549 Dorsalgia, unspecified: Secondary | ICD-10-CM | POA: Diagnosis not present

## 2022-07-30 DIAGNOSIS — R1900 Intra-abdominal and pelvic swelling, mass and lump, unspecified site: Secondary | ICD-10-CM | POA: Diagnosis not present

## 2022-07-30 DIAGNOSIS — R110 Nausea: Secondary | ICD-10-CM | POA: Diagnosis not present

## 2022-07-30 DIAGNOSIS — M1990 Unspecified osteoarthritis, unspecified site: Secondary | ICD-10-CM | POA: Diagnosis not present

## 2022-07-30 DIAGNOSIS — G4709 Other insomnia: Secondary | ICD-10-CM | POA: Diagnosis not present

## 2022-08-02 DIAGNOSIS — Z79899 Other long term (current) drug therapy: Secondary | ICD-10-CM | POA: Diagnosis not present

## 2022-08-27 DIAGNOSIS — R1900 Intra-abdominal and pelvic swelling, mass and lump, unspecified site: Secondary | ICD-10-CM | POA: Diagnosis not present

## 2022-08-27 DIAGNOSIS — M1990 Unspecified osteoarthritis, unspecified site: Secondary | ICD-10-CM | POA: Diagnosis not present

## 2022-08-27 DIAGNOSIS — R892 Abnormal level of other drugs, medicaments and biological substances in specimens from other organs, systems and tissues: Secondary | ICD-10-CM | POA: Diagnosis not present

## 2022-08-27 DIAGNOSIS — G4709 Other insomnia: Secondary | ICD-10-CM | POA: Diagnosis not present

## 2022-08-27 DIAGNOSIS — M549 Dorsalgia, unspecified: Secondary | ICD-10-CM | POA: Diagnosis not present

## 2022-08-27 DIAGNOSIS — Z9181 History of falling: Secondary | ICD-10-CM | POA: Diagnosis not present

## 2022-08-27 DIAGNOSIS — R7303 Prediabetes: Secondary | ICD-10-CM | POA: Diagnosis not present

## 2022-08-27 DIAGNOSIS — M797 Fibromyalgia: Secondary | ICD-10-CM | POA: Diagnosis not present

## 2022-08-27 DIAGNOSIS — Z79899 Other long term (current) drug therapy: Secondary | ICD-10-CM | POA: Diagnosis not present

## 2022-08-29 DIAGNOSIS — Z79899 Other long term (current) drug therapy: Secondary | ICD-10-CM | POA: Diagnosis not present

## 2022-09-24 DIAGNOSIS — M1990 Unspecified osteoarthritis, unspecified site: Secondary | ICD-10-CM | POA: Diagnosis not present

## 2022-09-24 DIAGNOSIS — R110 Nausea: Secondary | ICD-10-CM | POA: Diagnosis not present

## 2022-09-24 DIAGNOSIS — Z91148 Patient's other noncompliance with medication regimen for other reason: Secondary | ICD-10-CM | POA: Diagnosis not present

## 2022-09-24 DIAGNOSIS — Z9181 History of falling: Secondary | ICD-10-CM | POA: Diagnosis not present

## 2022-09-24 DIAGNOSIS — Z79899 Other long term (current) drug therapy: Secondary | ICD-10-CM | POA: Diagnosis not present

## 2022-09-24 DIAGNOSIS — R7303 Prediabetes: Secondary | ICD-10-CM | POA: Diagnosis not present

## 2022-09-24 DIAGNOSIS — M549 Dorsalgia, unspecified: Secondary | ICD-10-CM | POA: Diagnosis not present

## 2022-09-24 DIAGNOSIS — M797 Fibromyalgia: Secondary | ICD-10-CM | POA: Diagnosis not present

## 2022-09-24 DIAGNOSIS — E559 Vitamin D deficiency, unspecified: Secondary | ICD-10-CM | POA: Diagnosis not present

## 2022-09-26 DIAGNOSIS — Z79899 Other long term (current) drug therapy: Secondary | ICD-10-CM | POA: Diagnosis not present

## 2022-10-08 DIAGNOSIS — M5136 Other intervertebral disc degeneration, lumbar region: Secondary | ICD-10-CM | POA: Diagnosis not present

## 2022-10-08 DIAGNOSIS — M545 Low back pain, unspecified: Secondary | ICD-10-CM | POA: Diagnosis not present

## 2022-10-08 DIAGNOSIS — G894 Chronic pain syndrome: Secondary | ICD-10-CM | POA: Diagnosis not present

## 2022-10-08 DIAGNOSIS — Z79899 Other long term (current) drug therapy: Secondary | ICD-10-CM | POA: Diagnosis not present

## 2022-10-08 DIAGNOSIS — M797 Fibromyalgia: Secondary | ICD-10-CM | POA: Diagnosis not present

## 2022-10-08 DIAGNOSIS — M1990 Unspecified osteoarthritis, unspecified site: Secondary | ICD-10-CM | POA: Diagnosis not present

## 2022-10-10 DIAGNOSIS — Z79899 Other long term (current) drug therapy: Secondary | ICD-10-CM | POA: Diagnosis not present

## 2022-10-17 DIAGNOSIS — I4819 Other persistent atrial fibrillation: Secondary | ICD-10-CM | POA: Diagnosis not present

## 2022-10-17 DIAGNOSIS — E782 Mixed hyperlipidemia: Secondary | ICD-10-CM | POA: Diagnosis not present

## 2022-10-17 DIAGNOSIS — E559 Vitamin D deficiency, unspecified: Secondary | ICD-10-CM | POA: Diagnosis not present

## 2022-10-17 DIAGNOSIS — I10 Essential (primary) hypertension: Secondary | ICD-10-CM | POA: Diagnosis not present

## 2022-10-17 DIAGNOSIS — M797 Fibromyalgia: Secondary | ICD-10-CM | POA: Diagnosis not present

## 2022-10-17 DIAGNOSIS — R7303 Prediabetes: Secondary | ICD-10-CM | POA: Diagnosis not present

## 2022-10-17 DIAGNOSIS — K219 Gastro-esophageal reflux disease without esophagitis: Secondary | ICD-10-CM | POA: Diagnosis not present

## 2022-10-24 DIAGNOSIS — Z0283 Encounter for blood-alcohol and blood-drug test: Secondary | ICD-10-CM | POA: Diagnosis not present

## 2022-10-24 DIAGNOSIS — Z79899 Other long term (current) drug therapy: Secondary | ICD-10-CM | POA: Diagnosis not present

## 2022-11-23 DIAGNOSIS — G894 Chronic pain syndrome: Secondary | ICD-10-CM | POA: Diagnosis not present

## 2022-11-23 DIAGNOSIS — M5432 Sciatica, left side: Secondary | ICD-10-CM | POA: Diagnosis not present

## 2022-11-23 DIAGNOSIS — Z79891 Long term (current) use of opiate analgesic: Secondary | ICD-10-CM | POA: Diagnosis not present

## 2022-11-23 DIAGNOSIS — M542 Cervicalgia: Secondary | ICD-10-CM | POA: Diagnosis not present

## 2022-11-23 DIAGNOSIS — M545 Low back pain, unspecified: Secondary | ICD-10-CM | POA: Diagnosis not present

## 2022-11-23 DIAGNOSIS — R202 Paresthesia of skin: Secondary | ICD-10-CM | POA: Diagnosis not present

## 2022-11-23 DIAGNOSIS — M5431 Sciatica, right side: Secondary | ICD-10-CM | POA: Diagnosis not present

## 2022-12-21 DIAGNOSIS — M5432 Sciatica, left side: Secondary | ICD-10-CM | POA: Diagnosis not present

## 2022-12-21 DIAGNOSIS — M5431 Sciatica, right side: Secondary | ICD-10-CM | POA: Diagnosis not present

## 2022-12-21 DIAGNOSIS — M542 Cervicalgia: Secondary | ICD-10-CM | POA: Diagnosis not present

## 2022-12-21 DIAGNOSIS — G894 Chronic pain syndrome: Secondary | ICD-10-CM | POA: Diagnosis not present

## 2022-12-21 DIAGNOSIS — R202 Paresthesia of skin: Secondary | ICD-10-CM | POA: Diagnosis not present

## 2022-12-21 DIAGNOSIS — M545 Low back pain, unspecified: Secondary | ICD-10-CM | POA: Diagnosis not present

## 2022-12-21 DIAGNOSIS — Z79891 Long term (current) use of opiate analgesic: Secondary | ICD-10-CM | POA: Diagnosis not present

## 2022-12-24 ENCOUNTER — Other Ambulatory Visit: Payer: Self-pay | Admitting: Anesthesiology

## 2022-12-24 DIAGNOSIS — M5136 Other intervertebral disc degeneration, lumbar region with discogenic back pain only: Secondary | ICD-10-CM

## 2022-12-24 DIAGNOSIS — M4327 Fusion of spine, lumbosacral region: Secondary | ICD-10-CM

## 2023-01-12 ENCOUNTER — Other Ambulatory Visit: Payer: 59

## 2023-01-18 DIAGNOSIS — G894 Chronic pain syndrome: Secondary | ICD-10-CM | POA: Diagnosis not present

## 2023-01-18 DIAGNOSIS — Z79891 Long term (current) use of opiate analgesic: Secondary | ICD-10-CM | POA: Diagnosis not present

## 2023-02-05 DIAGNOSIS — E559 Vitamin D deficiency, unspecified: Secondary | ICD-10-CM | POA: Diagnosis not present

## 2023-02-05 DIAGNOSIS — M797 Fibromyalgia: Secondary | ICD-10-CM | POA: Diagnosis not present

## 2023-02-05 DIAGNOSIS — D72829 Elevated white blood cell count, unspecified: Secondary | ICD-10-CM | POA: Diagnosis not present

## 2023-02-05 DIAGNOSIS — E7800 Pure hypercholesterolemia, unspecified: Secondary | ICD-10-CM | POA: Diagnosis not present

## 2023-02-05 DIAGNOSIS — Z79891 Long term (current) use of opiate analgesic: Secondary | ICD-10-CM | POA: Diagnosis not present

## 2023-02-05 DIAGNOSIS — R7303 Prediabetes: Secondary | ICD-10-CM | POA: Diagnosis not present

## 2023-02-05 DIAGNOSIS — I10 Essential (primary) hypertension: Secondary | ICD-10-CM | POA: Diagnosis not present

## 2023-02-14 DIAGNOSIS — Z79891 Long term (current) use of opiate analgesic: Secondary | ICD-10-CM | POA: Diagnosis not present

## 2023-02-14 DIAGNOSIS — G894 Chronic pain syndrome: Secondary | ICD-10-CM | POA: Diagnosis not present

## 2023-02-15 DIAGNOSIS — R202 Paresthesia of skin: Secondary | ICD-10-CM | POA: Diagnosis not present

## 2023-02-15 DIAGNOSIS — M5431 Sciatica, right side: Secondary | ICD-10-CM | POA: Diagnosis not present

## 2023-02-15 DIAGNOSIS — M545 Low back pain, unspecified: Secondary | ICD-10-CM | POA: Diagnosis not present

## 2023-02-15 DIAGNOSIS — M5432 Sciatica, left side: Secondary | ICD-10-CM | POA: Diagnosis not present

## 2023-02-19 DIAGNOSIS — R030 Elevated blood-pressure reading, without diagnosis of hypertension: Secondary | ICD-10-CM | POA: Diagnosis not present

## 2023-02-19 DIAGNOSIS — Z79899 Other long term (current) drug therapy: Secondary | ICD-10-CM | POA: Diagnosis not present

## 2023-02-22 DIAGNOSIS — Z79899 Other long term (current) drug therapy: Secondary | ICD-10-CM | POA: Diagnosis not present

## 2023-03-15 DIAGNOSIS — M542 Cervicalgia: Secondary | ICD-10-CM | POA: Diagnosis not present

## 2023-03-15 DIAGNOSIS — G894 Chronic pain syndrome: Secondary | ICD-10-CM | POA: Diagnosis not present

## 2023-03-15 DIAGNOSIS — M545 Low back pain, unspecified: Secondary | ICD-10-CM | POA: Diagnosis not present

## 2023-03-15 DIAGNOSIS — Z79891 Long term (current) use of opiate analgesic: Secondary | ICD-10-CM | POA: Diagnosis not present

## 2023-03-15 DIAGNOSIS — R202 Paresthesia of skin: Secondary | ICD-10-CM | POA: Diagnosis not present

## 2023-04-12 DIAGNOSIS — G894 Chronic pain syndrome: Secondary | ICD-10-CM | POA: Diagnosis not present

## 2023-04-12 DIAGNOSIS — Z79891 Long term (current) use of opiate analgesic: Secondary | ICD-10-CM | POA: Diagnosis not present

## 2023-04-25 DIAGNOSIS — M542 Cervicalgia: Secondary | ICD-10-CM | POA: Diagnosis not present

## 2023-04-25 DIAGNOSIS — M545 Low back pain, unspecified: Secondary | ICD-10-CM | POA: Diagnosis not present

## 2023-04-25 DIAGNOSIS — R202 Paresthesia of skin: Secondary | ICD-10-CM | POA: Diagnosis not present

## 2023-05-07 DIAGNOSIS — G894 Chronic pain syndrome: Secondary | ICD-10-CM | POA: Diagnosis not present

## 2023-05-07 DIAGNOSIS — Z79891 Long term (current) use of opiate analgesic: Secondary | ICD-10-CM | POA: Diagnosis not present

## 2023-05-09 DIAGNOSIS — Z79899 Other long term (current) drug therapy: Secondary | ICD-10-CM | POA: Diagnosis not present

## 2023-06-04 DIAGNOSIS — Z79891 Long term (current) use of opiate analgesic: Secondary | ICD-10-CM | POA: Diagnosis not present

## 2023-06-04 DIAGNOSIS — M542 Cervicalgia: Secondary | ICD-10-CM | POA: Diagnosis not present

## 2023-06-04 DIAGNOSIS — G894 Chronic pain syndrome: Secondary | ICD-10-CM | POA: Diagnosis not present

## 2023-06-04 DIAGNOSIS — M545 Low back pain, unspecified: Secondary | ICD-10-CM | POA: Diagnosis not present

## 2023-07-02 DIAGNOSIS — R202 Paresthesia of skin: Secondary | ICD-10-CM | POA: Diagnosis not present

## 2023-07-02 DIAGNOSIS — M542 Cervicalgia: Secondary | ICD-10-CM | POA: Diagnosis not present

## 2023-07-02 DIAGNOSIS — M545 Low back pain, unspecified: Secondary | ICD-10-CM | POA: Diagnosis not present

## 2023-07-02 DIAGNOSIS — Z79891 Long term (current) use of opiate analgesic: Secondary | ICD-10-CM | POA: Diagnosis not present

## 2023-07-02 DIAGNOSIS — G894 Chronic pain syndrome: Secondary | ICD-10-CM | POA: Diagnosis not present

## 2023-08-05 DIAGNOSIS — Z79891 Long term (current) use of opiate analgesic: Secondary | ICD-10-CM | POA: Diagnosis not present

## 2023-08-05 DIAGNOSIS — G894 Chronic pain syndrome: Secondary | ICD-10-CM | POA: Diagnosis not present

## 2023-08-05 DIAGNOSIS — M5432 Sciatica, left side: Secondary | ICD-10-CM | POA: Diagnosis not present

## 2023-08-05 DIAGNOSIS — M5431 Sciatica, right side: Secondary | ICD-10-CM | POA: Diagnosis not present

## 2023-08-05 DIAGNOSIS — M545 Low back pain, unspecified: Secondary | ICD-10-CM | POA: Diagnosis not present

## 2023-08-05 DIAGNOSIS — R202 Paresthesia of skin: Secondary | ICD-10-CM | POA: Diagnosis not present

## 2023-09-03 DIAGNOSIS — M545 Low back pain, unspecified: Secondary | ICD-10-CM | POA: Diagnosis not present

## 2023-09-03 DIAGNOSIS — Z79891 Long term (current) use of opiate analgesic: Secondary | ICD-10-CM | POA: Diagnosis not present

## 2023-09-03 DIAGNOSIS — G894 Chronic pain syndrome: Secondary | ICD-10-CM | POA: Diagnosis not present

## 2023-09-03 DIAGNOSIS — M5432 Sciatica, left side: Secondary | ICD-10-CM | POA: Diagnosis not present

## 2023-09-03 DIAGNOSIS — R202 Paresthesia of skin: Secondary | ICD-10-CM | POA: Diagnosis not present

## 2023-09-03 DIAGNOSIS — M5431 Sciatica, right side: Secondary | ICD-10-CM | POA: Diagnosis not present

## 2023-09-10 DIAGNOSIS — M545 Low back pain, unspecified: Secondary | ICD-10-CM | POA: Diagnosis not present

## 2023-09-10 DIAGNOSIS — M5432 Sciatica, left side: Secondary | ICD-10-CM | POA: Diagnosis not present

## 2023-09-10 DIAGNOSIS — M5431 Sciatica, right side: Secondary | ICD-10-CM | POA: Diagnosis not present

## 2023-10-10 DIAGNOSIS — G894 Chronic pain syndrome: Secondary | ICD-10-CM | POA: Diagnosis not present

## 2023-10-10 DIAGNOSIS — M545 Low back pain, unspecified: Secondary | ICD-10-CM | POA: Diagnosis not present

## 2023-10-10 DIAGNOSIS — Z79891 Long term (current) use of opiate analgesic: Secondary | ICD-10-CM | POA: Diagnosis not present

## 2023-10-10 DIAGNOSIS — M5432 Sciatica, left side: Secondary | ICD-10-CM | POA: Diagnosis not present

## 2023-10-10 DIAGNOSIS — R202 Paresthesia of skin: Secondary | ICD-10-CM | POA: Diagnosis not present

## 2023-10-29 DIAGNOSIS — M4726 Other spondylosis with radiculopathy, lumbar region: Secondary | ICD-10-CM | POA: Diagnosis not present

## 2023-10-29 DIAGNOSIS — I10 Essential (primary) hypertension: Secondary | ICD-10-CM | POA: Diagnosis not present

## 2023-10-29 DIAGNOSIS — Z79891 Long term (current) use of opiate analgesic: Secondary | ICD-10-CM | POA: Diagnosis not present

## 2023-10-29 DIAGNOSIS — E7800 Pure hypercholesterolemia, unspecified: Secondary | ICD-10-CM | POA: Diagnosis not present

## 2023-10-29 DIAGNOSIS — M51369 Other intervertebral disc degeneration, lumbar region without mention of lumbar back pain or lower extremity pain: Secondary | ICD-10-CM | POA: Diagnosis not present

## 2023-10-29 DIAGNOSIS — K625 Hemorrhage of anus and rectum: Secondary | ICD-10-CM | POA: Diagnosis not present

## 2023-10-30 DIAGNOSIS — R7303 Prediabetes: Secondary | ICD-10-CM | POA: Diagnosis not present

## 2023-10-30 DIAGNOSIS — K629 Disease of anus and rectum, unspecified: Secondary | ICD-10-CM | POA: Diagnosis not present

## 2023-10-30 DIAGNOSIS — E7800 Pure hypercholesterolemia, unspecified: Secondary | ICD-10-CM | POA: Diagnosis not present

## 2023-10-30 DIAGNOSIS — I10 Essential (primary) hypertension: Secondary | ICD-10-CM | POA: Diagnosis not present

## 2023-10-31 DIAGNOSIS — E875 Hyperkalemia: Secondary | ICD-10-CM | POA: Diagnosis not present

## 2023-11-01 DIAGNOSIS — E875 Hyperkalemia: Secondary | ICD-10-CM | POA: Diagnosis not present

## 2023-11-07 DIAGNOSIS — M545 Low back pain, unspecified: Secondary | ICD-10-CM | POA: Diagnosis not present

## 2023-11-07 DIAGNOSIS — M542 Cervicalgia: Secondary | ICD-10-CM | POA: Diagnosis not present

## 2023-11-07 DIAGNOSIS — M5431 Sciatica, right side: Secondary | ICD-10-CM | POA: Diagnosis not present

## 2023-12-03 DIAGNOSIS — R634 Abnormal weight loss: Secondary | ICD-10-CM | POA: Diagnosis not present

## 2023-12-03 DIAGNOSIS — I10 Essential (primary) hypertension: Secondary | ICD-10-CM | POA: Diagnosis not present

## 2023-12-03 DIAGNOSIS — E875 Hyperkalemia: Secondary | ICD-10-CM | POA: Diagnosis not present

## 2023-12-05 DIAGNOSIS — G894 Chronic pain syndrome: Secondary | ICD-10-CM | POA: Diagnosis not present

## 2023-12-05 DIAGNOSIS — M545 Low back pain, unspecified: Secondary | ICD-10-CM | POA: Diagnosis not present

## 2023-12-05 DIAGNOSIS — M5432 Sciatica, left side: Secondary | ICD-10-CM | POA: Diagnosis not present

## 2023-12-25 DIAGNOSIS — R202 Paresthesia of skin: Secondary | ICD-10-CM | POA: Diagnosis not present

## 2023-12-25 DIAGNOSIS — M545 Low back pain, unspecified: Secondary | ICD-10-CM | POA: Diagnosis not present

## 2023-12-25 DIAGNOSIS — M5431 Sciatica, right side: Secondary | ICD-10-CM | POA: Diagnosis not present

## 2023-12-25 DIAGNOSIS — M5432 Sciatica, left side: Secondary | ICD-10-CM | POA: Diagnosis not present
